# Patient Record
Sex: Female | Born: 1990 | Race: White | Hispanic: No | Marital: Single | Smoking: Current some day smoker
Health system: Southern US, Community
[De-identification: ages and names within clinical notes are randomized; demographics above are authoritative.]

## PROBLEM LIST (undated history)

## (undated) DIAGNOSIS — K759 Inflammatory liver disease, unspecified: Secondary | ICD-10-CM

## (undated) DIAGNOSIS — R87629 Unspecified abnormal cytological findings in specimens from vagina: Secondary | ICD-10-CM

## (undated) DIAGNOSIS — B1920 Unspecified viral hepatitis C without hepatic coma: Secondary | ICD-10-CM

## (undated) DIAGNOSIS — F909 Attention-deficit hyperactivity disorder, unspecified type: Secondary | ICD-10-CM

## (undated) DIAGNOSIS — F419 Anxiety disorder, unspecified: Secondary | ICD-10-CM

## (undated) DIAGNOSIS — F32A Depression, unspecified: Secondary | ICD-10-CM

## (undated) HISTORY — PX: BREAST SURGERY: SHX581

## (undated) HISTORY — DX: Unspecified abnormal cytological findings in specimens from vagina: R87.629

## (undated) HISTORY — DX: Anxiety disorder, unspecified: F41.9

## (undated) HISTORY — DX: Depression, unspecified: F32.A

## (undated) HISTORY — DX: Attention-deficit hyperactivity disorder, unspecified type: F90.9

---

## 2015-06-20 ENCOUNTER — Emergency Department (EMERGENCY_DEPARTMENT_HOSPITAL): Payer: PRIVATE HEALTH INSURANCE | Admitting: Professional

## 2015-06-20 ENCOUNTER — Inpatient Hospital Stay: Payer: PRIVATE HEALTH INSURANCE | Admitting: Addiction Medicine

## 2015-06-20 ENCOUNTER — Emergency Department
Admission: EM | Admit: 2015-06-20 | Discharge: 2015-06-20 | Disposition: A | Discharge status: Other Acute Inpatient Hospital | Payer: Commercial Managed Care - PPO | Attending: Emergency Medicine | Admitting: Emergency Medicine

## 2015-06-20 ENCOUNTER — Encounter (HOSPITAL_BASED_OUTPATIENT_CLINIC_OR_DEPARTMENT_OTHER): Payer: Self-pay | Admitting: Professional

## 2015-06-20 ENCOUNTER — Inpatient Hospital Stay
Admission: AD | Admit: 2015-06-20 | Discharge: 2015-06-26 | DRG: 895 | Disposition: A | Discharge status: Home / Self Care | Payer: PRIVATE HEALTH INSURANCE | Source: Ambulatory Visit | Attending: Addiction Medicine | Admitting: Addiction Medicine

## 2015-06-20 ENCOUNTER — Encounter: Payer: Self-pay | Admitting: Addiction Medicine

## 2015-06-20 DIAGNOSIS — F1120 Opioid dependence, uncomplicated: Secondary | ICD-10-CM

## 2015-06-20 DIAGNOSIS — F1320 Sedative, hypnotic or anxiolytic dependence, uncomplicated: Secondary | ICD-10-CM

## 2015-06-20 DIAGNOSIS — F17210 Nicotine dependence, cigarettes, uncomplicated: Secondary | ICD-10-CM | POA: Diagnosis present

## 2015-06-20 DIAGNOSIS — F1310 Sedative, hypnotic or anxiolytic abuse, uncomplicated: Secondary | ICD-10-CM | POA: Insufficient documentation

## 2015-06-20 DIAGNOSIS — F1910 Other psychoactive substance abuse, uncomplicated: Secondary | ICD-10-CM

## 2015-06-20 DIAGNOSIS — F1123 Opioid dependence with withdrawal: Principal | ICD-10-CM | POA: Diagnosis present

## 2015-06-20 DIAGNOSIS — B1920 Unspecified viral hepatitis C without hepatic coma: Secondary | ICD-10-CM | POA: Diagnosis present

## 2015-06-20 DIAGNOSIS — F419 Anxiety disorder, unspecified: Secondary | ICD-10-CM | POA: Diagnosis present

## 2015-06-20 DIAGNOSIS — F1110 Opioid abuse, uncomplicated: Secondary | ICD-10-CM | POA: Insufficient documentation

## 2015-06-20 DIAGNOSIS — R6889 Other general symptoms and signs: Secondary | ICD-10-CM | POA: Diagnosis present

## 2015-06-20 DIAGNOSIS — Z590 Homelessness: Secondary | ICD-10-CM

## 2015-06-20 HISTORY — DX: Unspecified viral hepatitis C without hepatic coma: B19.20

## 2015-06-20 LAB — CBC
Hematocrit: 41.3 % (ref 37.0–47.0)
Hgb: 13.5 g/dL (ref 12.0–16.0)
MCH: 28.8 pg (ref 28.0–32.0)
MCHC: 32.7 g/dL (ref 32.0–36.0)
MCV: 88.1 fL (ref 80.0–100.0)
MPV: 10.5 fL (ref 9.4–12.3)
Nucleated RBC: 0 /100 WBC (ref 0–1)
Platelets: 405 10*3/uL — ABNORMAL HIGH (ref 140–400)
RBC: 4.69 10*6/uL (ref 4.20–5.40)
RDW: 14 % (ref 12–15)
WBC: 8.5 10*3/uL (ref 3.50–10.80)

## 2015-06-20 LAB — URINALYSIS, REFLEX TO MICROSCOPIC EXAM IF INDICATED
Bilirubin, UA: NEGATIVE
Blood, UA: NEGATIVE
Glucose, UA: NEGATIVE
Ketones UA: NEGATIVE
Leukocyte Esterase, UA: NEGATIVE
Nitrite, UA: NEGATIVE
Protein, UR: NEGATIVE
Specific Gravity UA: 1.023 (ref 1.001–1.035)
Urine pH: 7 (ref 5.0–8.0)
Urobilinogen, UA: NORMAL mg/dL

## 2015-06-20 LAB — RAPID DRUG SCREEN, URINE
Barbiturate Screen, UR: NEGATIVE
Benzodiazepine Screen, UR: NEGATIVE
Cannabinoid Screen, UR: NEGATIVE
Cocaine, UR: NEGATIVE
Opiate Screen, UR: NEGATIVE
PCP Screen, UR: NEGATIVE
Urine Amphetamine Screen: NEGATIVE

## 2015-06-20 LAB — COMPREHENSIVE METABOLIC PANEL
ALT: 172 U/L — ABNORMAL HIGH (ref 0–55)
AST (SGOT): 92 U/L — ABNORMAL HIGH (ref 5–34)
Albumin/Globulin Ratio: 1 (ref 0.9–2.2)
Albumin: 4 g/dL (ref 3.5–5.0)
Alkaline Phosphatase: 105 U/L (ref 37–106)
BUN: 16 mg/dL (ref 7.0–19.0)
Bilirubin, Total: 0.4 mg/dL (ref 0.2–1.2)
CO2: 27 mEq/L (ref 22–29)
Calcium: 9.8 mg/dL (ref 8.5–10.5)
Chloride: 104 mEq/L (ref 100–111)
Creatinine: 0.8 mg/dL (ref 0.6–1.0)
Globulin: 3.9 g/dL — ABNORMAL HIGH (ref 2.0–3.6)
Glucose: 93 mg/dL (ref 70–100)
Potassium: 4.5 mEq/L (ref 3.5–5.1)
Protein, Total: 7.9 g/dL (ref 6.0–8.3)
Sodium: 138 mEq/L (ref 136–145)

## 2015-06-20 LAB — POCT PREGNANCY TEST, URINE HCG: POCT Pregnancy HCG Test, UR: NEGATIVE

## 2015-06-20 LAB — TSH: TSH: 0.9 u[IU]/mL (ref 0.35–4.94)

## 2015-06-20 LAB — GFR: EGFR: >60

## 2015-06-20 LAB — SALICYLATE LEVEL: Salicylate Level: <5 mg/dL — ABNORMAL LOW (ref 15.0–30.0)

## 2015-06-20 LAB — ACETAMINOPHEN LEVEL: Acetaminophen Level: <6 ug/mL — ABNORMAL LOW (ref 10–30)

## 2015-06-20 LAB — ETHANOL: Alcohol: NOT DETECTED mg/dL

## 2015-06-20 MED ORDER — PHENOBARBITAL 32.4 MG PO TABS
32.4000 mg | ORAL_TABLET | ORAL | Status: DC | PRN
Start: 2015-06-20 — End: 2015-06-26
  Administered 2015-06-20 – 2015-06-25 (×16): 32.4 mg via ORAL
  Filled 2015-06-20 (×16): qty 1

## 2015-06-20 MED ORDER — ALUM & MAG HYDROXIDE-SIMETH 200-200-20 MG/5ML PO SUSP
30.0000 mL | ORAL | Status: DC | PRN
Start: 2015-06-20 — End: 2015-06-26

## 2015-06-20 MED ORDER — HYDROXYZINE PAMOATE 25 MG PO CAPS
25.0000 mg | ORAL_CAPSULE | Freq: Four times a day (QID) | ORAL | Status: DC | PRN
Start: 2015-06-20 — End: 2015-06-26

## 2015-06-20 MED ORDER — IBUPROFEN 600 MG PO TABS
600.0000 mg | ORAL_TABLET | Freq: Four times a day (QID) | ORAL | Status: DC | PRN
Start: 2015-06-20 — End: 2015-06-26

## 2015-06-20 MED ORDER — VENLAFAXINE HCL ER 150 MG PO CP24
150.0000 mg | ORAL_CAPSULE | Freq: Every day | ORAL | Status: DC
Start: 2015-06-21 — End: 2015-06-26
  Administered 2015-06-21 – 2015-06-26 (×6): 150 mg via ORAL
  Filled 2015-06-20 (×6): qty 1

## 2015-06-20 MED ORDER — LOPERAMIDE HCL 2 MG PO CAPS
2.0000 mg | ORAL_CAPSULE | ORAL | Status: DC | PRN
Start: 2015-06-20 — End: 2015-06-26

## 2015-06-20 MED ORDER — LAMOTRIGINE 100 MG PO TABS
100.0000 mg | ORAL_TABLET | Freq: Every day | ORAL | Status: DC
Start: 2015-06-21 — End: 2015-06-26
  Administered 2015-06-21 – 2015-06-26 (×6): 100 mg via ORAL
  Filled 2015-06-20 (×6): qty 1

## 2015-06-20 MED ORDER — NALOXONE HCL 0.4 MG/ML IJ SOLN (WRAP)
0.8000 mg | INTRAMUSCULAR | Status: DC | PRN
Start: 2015-06-20 — End: 2015-06-26

## 2015-06-20 MED ORDER — ONDANSETRON 4 MG PO TBDP
4.0000 mg | ORAL_TABLET | Freq: Four times a day (QID) | ORAL | Status: DC | PRN
Start: 2015-06-20 — End: 2015-06-26

## 2015-06-20 MED ORDER — TUBERCULIN PPD 5 UNIT/0.1ML ID SOLN
0.1000 mL | Freq: Once | INTRADERMAL | Status: DC
Start: 2015-06-21 — End: 2015-06-26

## 2015-06-20 MED ORDER — FOLIC ACID 1 MG PO TABS
1.0000 mg | ORAL_TABLET | Freq: Every day | ORAL | Status: DC
Start: 2015-06-21 — End: 2015-06-26
  Administered 2015-06-21 – 2015-06-26 (×6): 1 mg via ORAL
  Filled 2015-06-20 (×6): qty 1

## 2015-06-20 MED ORDER — NICOTINE POLACRILEX 2 MG MT GUM
2.0000 mg | CHEWING_GUM | OROMUCOSAL | Status: DC | PRN
Start: 2015-06-20 — End: 2015-06-26
  Administered 2015-06-21: 2 mg via BUCCAL
  Filled 2015-06-20: qty 1

## 2015-06-20 MED ORDER — TRAZODONE HCL 50 MG PO TABS
75.0000 mg | ORAL_TABLET | Freq: Every evening | ORAL | Status: DC | PRN
Start: 2015-06-20 — End: 2015-06-20

## 2015-06-20 MED ORDER — PROMETHAZINE HCL 25 MG/ML IJ SOLN
12.5000 mg | INTRAMUSCULAR | Status: DC | PRN
Start: 2015-06-20 — End: 2015-06-26

## 2015-06-20 MED ORDER — THIAMINE HCL 100 MG PO TABS
100.0000 mg | ORAL_TABLET | Freq: Every day | ORAL | Status: DC
Start: 2015-06-21 — End: 2015-06-26
  Administered 2015-06-21 – 2015-06-26 (×6): 100 mg via ORAL
  Filled 2015-06-20 (×6): qty 1

## 2015-06-20 MED ORDER — QUETIAPINE FUMARATE 100 MG PO TABS
200.0000 mg | ORAL_TABLET | Freq: Every evening | ORAL | Status: DC
Start: 2015-06-21 — End: 2015-06-26
  Administered 2015-06-21 – 2015-06-25 (×5): 200 mg via ORAL
  Filled 2015-06-20 (×5): qty 2

## 2015-06-20 MED ORDER — ALIAS MED C3A
1.0000 | ORAL_FILM | SUBLINGUAL | Status: DC | PRN
Start: 2015-06-20 — End: 2015-06-22
  Administered 2015-06-21 (×4): 1 via SUBLINGUAL
  Filled 2015-06-20 (×4): qty 1

## 2015-06-20 MED ORDER — CLONIDINE HCL 0.1 MG PO TABS
0.1000 mg | ORAL_TABLET | ORAL | Status: DC | PRN
Start: 2015-06-20 — End: 2015-06-26

## 2015-06-20 MED ORDER — MAGNESIUM HYDROXIDE 400 MG/5ML PO SUSP
30.0000 mL | Freq: Every evening | ORAL | Status: DC | PRN
Start: 2015-06-20 — End: 2015-06-26

## 2015-06-20 MED ORDER — TAB-A-VITE/BETA CAROTENE PO TABS
1.0000 | ORAL_TABLET | Freq: Every day | ORAL | Status: DC
Start: 2015-06-21 — End: 2015-06-26
  Administered 2015-06-21 – 2015-06-26 (×6): 1 via ORAL
  Filled 2015-06-20 (×6): qty 1

## 2015-06-20 MED ORDER — ATENOLOL 50 MG PO TABS
25.0000 mg | ORAL_TABLET | Freq: Two times a day (BID) | ORAL | Status: DC | PRN
Start: 2015-06-20 — End: 2015-06-26

## 2015-06-20 MED ORDER — DICYCLOMINE HCL 10 MG PO CAPS
20.0000 mg | ORAL_CAPSULE | ORAL | Status: DC | PRN
Start: 2015-06-20 — End: 2015-06-26

## 2015-06-20 MED ORDER — NICOTINE 21 MG/24HR TD PT24
1.0000 | MEDICATED_PATCH | Freq: Every day | TRANSDERMAL | Status: DC
Start: 2015-06-21 — End: 2015-06-26
  Administered 2015-06-21: 1 via TRANSDERMAL
  Filled 2015-06-20 (×3): qty 1

## 2015-06-20 NOTE — Discharge Instructions (Signed)
Dear Ms. Runnion:    Thank you for choosing the [facility] Emergency Department, the premier emergency department in the [place] area.  I hope your visit today was EXCELLENT.    Specific instructions for your visit today:      Substance / Drug Abuse    After evaluating you today, your doctor believes that you have a problem with substance abuse.    Substance abuse is a physical addiction to a drug. It is a serious problem that can be life-threatening. It can ruin your life as well as the lives of those who care about you.    It is important to have a counselor and a family doctor who see you on a regular basis. A counselor can help you with your problem, keep a close eye on you and follow your progress.    Stay with someone who can watch you tonight and who can help you avoid situations where you are likely to abuse again.    DO NOT DRIVE A VEHICLE UNDER THE INFLUENCE OF ALCOHOL OR OTHER ILLEGAL SUBSTANCES! YOU MAY INJURE OR KILL YOURSELF OR SOMEONE ELSE IF YOU DRINK OR USE AND DRIVE.    YOU SHOULD SEEK MEDICAL ATTENTION IMMEDIATELY, EITHER HERE OR AT THE NEAREST EMERGENCY DEPARTMENT, IF ANY OF THE FOLLOWING OCCURS:   You think of harming yourself or committing suicide.    You feel unsafe in your home environment.   You become worse or feel that you cannot wait until your follow-up appointment for treatment.                If you do not continue to improve or your condition worsens, please contact your doctor or return immediately to the Emergency Department.    Sincerely,  Koleen Distance, MD  Attending Emergency Physician  [facility] Emergency Department    ONSITE PHARMACY  Our full service onsite pharmacy is located in the ER waiting room.  Open 7 days a week from 9 am to 11 pm.  We accept all major insurances and prices are competitive with major retailers.  Ask your provider to print your prescriptions down to the pharmacy to speed you on your way home.    OBTAINING A PRIMARY CARE  APPOINTMENT    Primary care physicians (PCPs, also known as primary care doctors) are either internists or family medicine doctors. Both types of PCPs focus on health promotion, disease prevention, patient education and counseling, and treatment of acute and chronic medical conditions.    Call for an appointment with a primary care doctor.  Ask to see who is taking new patients.     [facility]  telephone:  [phone]  [email or web address]    DOCTOR REFERRALS  Call [phone] (available 24 hours a day, 7 days a week) if you need any further referrals and we can help you find a primary care doctor or specialist.  Also, available online at:  [email or web address]    YOUR CONTACT INFORMATION  Before leaving please check with registration to make sure we have an up-to-date contact number.  You can call registration at [phone] to update your information.  For questions about your hospital bill, please call [phone].  For questions about your Emergency Dept Physician bill please call [phone].      FREE HEALTH SERVICES  If you need help with health or social services, please call 2-1-1 for a free referral to resources in your area.  2-1-1 is a free service connecting  people with information on health insurance, free clinics, pregnancy, mental health, dental care, food assistance, housing, and substance abuse counseling.  Also, available online at:  http://www.[place].org    MEDICAL RECORDS AND TESTS  Certain laboratory test results do not come back the same day, for example urine cultures.   We will contact you if other important findings are noted.  Radiology films are often reviewed again to ensure accuracy.  If there is any discrepancy, we will notify you.      Please call [phone] to pick up a complimentary CD of any radiology studies performed.  If you or your doctor would like to request a copy of your medical records, please call [phone].      ORTHOPEDIC  INJURY   Please know that significant injuries can exist even when an initial x-ray is read as normal or negative.  This can occur because some fractures (broken bones) are not initially visible on x-rays.  For this reason, close outpatient follow-up with your primary care doctor or bone specialist (orthopedist) is required.    MEDICATIONS AND FOLLOWUP  Please be aware that some prescription medications can cause drowsiness.  Use caution when driving or operating machinery.    The examination and treatment you have received in our Emergency Department is provided on an emergency basis, and is not intended to be a substitute for your primary care physician.  It is important that your doctor checks you again and that you report any new or remaining problems at that time.      24 HOUR PHARMACIES  The nearest 24 hour pharmacy is:    [facility]  [address]  [place], [place] [address]  [phone]      ASSISTANCE WITH INSURANCE    Affordable Care Act  [facility])  Call to start or finish an application, compare plans, enroll or ask a question.  [phone]  TTY: [phone]  Web:  Healthcare.gov    Help Enrolling in [facility]  [employer] [place]  (707)015-0034 (TOLL-FREE)  [phone] (TTY)  Web:  Http://www.coverva.org    Local Help Enrolling in the [facility]  [place] [facility]  [phone] (MAIN)  Email:  health-help@nvfs .org  Web:  BlackjackMyths.is  Address:  [address], [address] [place], [place] [address]    SEDATING MEDICATIONS  Sedating medications include strong pain medications (e.g. narcotics), muscle relaxers, benzodiazepines (used for anxiety and as muscle relaxers), Benadryl/diphenhydramine and other antihistamines for allergic reactions/itching, and other medications.  If you are unsure if you have received a sedating medication, please ask your physician or nurse.  If you received a sedating medication: DO NOT drive a car. DO NOT operate machinery. DO NOT perform  jobs where you need to be alert.  DO NOT drink alcoholic beverages while taking this medicine.     If you get dizzy, sit or lie down at the first signs. Be careful going up and down stairs.  Be extra careful to prevent falls.     Never give this medicine to others.     Keep this medicine out of reach of children.     Do not take or save old medicines. Throw them away when outdated.     Keep all medicines in a cool, dry place. DO NOT keep them in your bathroom medicine cabinet or in a cabinet above the stove.    MEDICATION REFILLS  Please be aware that we cannot refill any prescriptions through the ER. If you need  further treatment from what is provided at your ER visit, please follow up with your primary care doctor or your pain management specialist.    FREESTANDING EMERGENCY DEPARTMENTS OF [facility]  Did you know [facility] has two freestanding ERs located just a few miles away?  [facility] and [facility] [place] have short wait times, easy free parking directly in front of the building and top patient satisfaction scores - and the same Board Certified Emergency Medicine doctors as [facility].              Zyair Rhein  [id number]  [phone]  [phone]  06/20/2015    Discharge Instructions    As always, you are the most important factor in your recovery.  Please follow these instructions carefully.  If you have problems that we have not discussed, CALL OR VISIT YOUR DOCTOR RIGHT AWAY.     If you can't reach your doctor, return to the emergency department.    I Nida Boatman understand the written and discussed instructions.  My questions have been answered.  I acknowledge receipt of these instructions.     Patient or responsible person:         Patient's Signature               Physician or Nurse

## 2015-06-20 NOTE — ED Notes (Signed)
6:43 PM [name]  I completed a brief evaluation of this patient and placed initial orders in triage to expedite patient care.  I am not the primary physician taking care of this patient.  Well appearing, cooperative in triage.    Leanora Ivanoff, MD  06/20/15 [phone]

## 2015-06-20 NOTE — Progress Notes (Signed)
This Clinical research associate called [facility] and spoke with Reynold Bowen [phone] who took initial information to pass to care manager. Care manager will call [facility] [phone] to take clinical information and complete prior authorization. Prior authorization has not been obtained at this time.

## 2015-06-20 NOTE — H&P (Signed)
[facility] - HISTORY and PHYSICAL    Date/Time:    06/21/2015    6.00 am  Patient Name: [name], [name]  MRN:  [id number]  Age: 25 y.o.  DOB: 1991/01/03    Vital Signs:     02/20 0700  02/21 0659 02/21 0700  02/21 2330    Most Recent         Temp (F)       98.2-99.3  99.3 (37.4)     Heart Rate       66-91  91     Resp Rate   18  18     BP   135/82  135/82     SpO2 (%)       96-98  98     Height (cm)   165.1  165.1 cm (5\' 5" )     Weight (kg)   74.844  74.844 kg (165 lb)     BMI (calculated)   27.5  27.5              Allergies:   No Known Allergies    Chief Complaints:   "I need to detox"  Anxiety  Depressed      Current Health Problems:   This is a 25 year old female admitted from the ER after medical clearance.  Patient was treated in [facility] but relapsed soon after.  She was on 2-3 grams of heroin IV until 2 weeks ago.  She has been using Suboxone 8-16 mg a day off the street.  She also uses Xanax 1-2  Bars a day.  She is anxious and depressed on Lamictal, Serouel and Effexor XR.  She has HCV.  She smokes half a pack a day.            Review of Systems:   A comprehensive review of systems was negative except for:  Constitutional: positive for chills and sweats  Eyes: Negative  Ears, nose, mouth, throat, and face: Negative  Respiratory: Negative  Cardiovascular: Negative  Gastrointestinal: Negative  Integument/breast: Negative  Hematologic/lymphatic: Negative  Musculoskeletal: Negative  Neurological: positive for Negative  Behavioral/Psych: positive for anxiety, depression and illegal drug usage  Endocrine: Negative    History of Past Illnesses (Serious illnesses, injuries, surgeries, hospitalizations, including psychiatric admissions):     Past Medical History   Diagnosis Date   . Hepatitis C        Family Medical History:   History reviewed. No pertinent family history.    Social History:     History   Alcohol Use No     History   Drug Use Not on file     History   Smoking status   . Current  Every Day Smoker -- 0.50 packs/day   . Types: Cigarettes   Smokeless tobacco   . Never Used         Physical Exam:     General appearance - oriented to person, place, and time   Mental status -  anxious  Eyes - pupils equal and reactive, extraocular eye movements intact  Ears - bilateral TM's and external ear canals normal  Nose - no septal perforation  Mouth - mucous membranes moist, pharynx normal without lesions  Neck - supple, no significant adenopathy  Chest - clear to auscultation, no wheezes, rales or rhonchi, symmetric air entry  Heart - normal rate, regular rhythm, normal S1, S2, no murmurs, rubs, clicks or gallops  Abdomen - soft, nontender, nondistended, no masses or organomegaly  Pelvic - deferred to PCP  LMP - Patient's last menstrual period was 06/02/2015.  Is patient pregnant: No  Rectal - deferred to PCP.  Neurological - alert, oriented, normal speech, no focal findings or movement disorder noted, screening mental status exam normal, neck supple without rigidity, cranial nerves II through XII intact, motor and sensory grossly normal bilaterally, normal muscle tone, mild tremors, strength 5/5  Musculoskeletal - no joint tenderness, deformity or swelling  Extremities - peripheral pulses normal, no pedal edema, no clubbing or cyanosis  Skin - track marks    Mental Status Exam:   Appearance: No apparent distress  Behavior/relationship to examiner/demeanor:  Guarded  Motor activity/EPS:  Normal  Gait:   Normal  Mood (subjective report):   anxious  Affect (objective appearance):  Appropriate/mood-congruent  Fund of Knowledge/Intelligence:   Average  Abstraction:   Normal  Insight:   Fair  Judgment:   Fair      Clinical Diagnosis:    Axis I: Sedative dependence, Anxiety disorder, unspecified anxiety disorder type, Depressive disorder and Opioid dependence with withdrawal   Axis ZO:XWRUE   Axis III: HCV   Axis IV: Other psychosocial or environmental problems   Problems with primary support group   Axis  V:41-50: serious symptoms.: Highest in the last year 65.    This patient has been informed of their individual treatment plan.  This includes an explanation of the action of all prescribed psychoactive medications.    List of Prescribed Psychoactive Medications (Antidepressants, Mood Stabilizers, Antipsychotics, Antianxiety, Stimulants):   Suboxone  Phenobarbital  Vistaril  Effexor XR  Seroquel  Lamictal    The benefits, potential side effects, risks, and possible drug interactions were explained to this patient who indicated that she understood and agrees to the plan of care.   All patient questions were answered.    Plan:  Admit to Detoxification  Phenobarbital Detoxification  Suboxone Detoxification  Substance Abuse Counseling  IOP Triage  Discuss with treatment team  Review labs with patient  COUNSEL ON SMOKING CESSATION  OFFER Rx TO STOP SMOKING    I certify that inpatient services are medically necessary for this patient.      Signed by: Nicholaus Bloom, MD     06/21/2015    6.00 am

## 2015-06-20 NOTE — Progress Notes (Signed)
Date/Time: 06/20/2015, 9:28 AM  Interviewer: [name]  Patient Name: [name]   DOB: [birth year]  SSN: [id number]  Gender: female  Patient Address:  [address] [place] Dakin [place]  Patient Phone Number(s):  There are no phone numbers on file. , additional phone number: [phone]    Have you confirmed the pt's contact information above? Yes      Emergency Contact: Bernerd Pho, Phone number: [phone]  Relationship to Pt: MOther   May we contact this person regarding your admission? yes    Caller: Self  , Caller phone:Self, Caller relationship:    Insurance Info:   Company: Development worker, community (PH):   [place] [employer]   Relationship to PT: Mother     PH DOB: [birth year]  Member ID: [id number]  Group #: [id number]    Insured Employer: [employer]  Phone number: Providers/Mental Health/SA: [phone]  Insurance Address: [address] [place] [address]    Identify if possible:   Referring Provider Name: My boyfriend was there  Phone Number, if available:      Clinical Information  Presenting Problem: I have been taking suboxone and benzos, I need to detox  Precipitating Event: I want to get into sober living, so I need to do this  Suicidal/Homicidal Risk?    Any hx of Suicidal thoughts or plans? No    Any hx of Homicidal Thoughts or Plans?  No    Imminent Risk of Suicide and/or Harm to Others?  No         Drug Use  Do you use tobacco?: Yes cigarettes, a half a pack a day     Any current use of alcohol or drugs? benzodiazepines and suboxone      Drug of Choice # 1: Suboxone  Pattern of use: I take 2 8mg  a day, and I don't have a prescription, I buy them at the street. Sublingual use. I have been doing it for about a month. I was using heroin beforre that. I would use 3gr a day IV. Has not done it in a month   Last use? This am    Drug of Choice # 2 : Benzodiazepines  Pattern of use: 3 bar Xanax a day, I have been doing this for a year. I take them together. Oral use  Last use? Today    Drug  of Choice # 3 : Denied  Pattern of use: Denied  Last use?: Denied    Additional drug use info:     Current Withdrawal Symptoms:  None at the moment    History of and last date of:    Hallucinations? No          Seizures?  No    Psych/Mental health Issues: depression and anxiety        Medical issues: Hep C, I have not done anything. She has been aware of it for a year    Current Medication (medical or psychiatric): Effexor 150mg , Lamictal 150mg , Seroquel 200mg . I take them every day    Additional Notes:     Does the patient require Hard of Hearing Services? No    Does the patient require language services? No    Preliminary Diagnosis: Opioid Use Disorder, Severe and Sedative, Hypnotic or Anxiolytic Use Disorder, Severe    Recommended Level of Care (LOC): inpatient    Appointment with: [facility] Adm nurse      Location: ED [facility] ?[facility] inpatient  Date: 06/20/15             Time: 1900    LOC: Inpatient - Alcohol, benzodiazepines & sometimes opiates (use within the last 48 hours)  Day Treatment - Opiates & everything else (or last use >48 hours); NOT marijuana  IOP - 7 days abstinent from all substances EXCEPT marijuana    * Current IOP counselors that do assessments:   Rosana Hoes, Laroy Apple, Kevan Rosebush, Victorino Dike    * [facility] DTX appointments 8am or 10am Mon-Fri ONLY at [address] (Do not include "step down" appointments in the scheduling process)     *[facility] IP appointments 1230pm & 2pm - after hours starts @ 3pm; appointments every 2 hours. Last appt at 9pm    [facility]    * In Basket Instructions   - [facility] IP; Gypsy Lore, Eula Listen, & Malachy Mood   - [facility] IOP; Blondell Reveal Marvell Fuller, & assessment    clinican

## 2015-06-20 NOTE — Progress Notes (Signed)
Psychiatric Evaluation Part I    [name] is a 25 y.o. female admitted to the [facility] Emergency Department who was seen face to face on 06/20/2015 by Louanne Belton, LPC.    Call Details  Patient Location: [facility] ED  Patient Room Number: RW 4  Time contacted by ED Physician: 2055  Time consult began: 2120  Time (in minutes) from Call to Consult: 25  Time consult concluded: 2135           Discharge Planning  Living Arrangements: Alone  Support Systems: Parent, Family members, Spouse/significant other, Other (Comment), Friends/neighbors (Pt reported that she attends NA meetings)  Type of Residence: Homeless (Pt reported she has been staying in the back of her boyfriend's car)  Patient expects to be discharged to:: [facility] Inpatient Detox    Presenting Mental Status  Orientation Level: Oriented X4  Memory: No Impairment  Thought Content: normal  Thought Process: concrete  Behavior: agitated, restless  Consciousness: Alert  Impulse Control: impaired  Perception: normal  Eye Contact: poor  Attitude: cooperative  Mood: elevated, anxious, irritable  Hopelessness Affects Goals: No  Hopelessness About Future: No  Affect: normal  Speech: pressured, loud  Concentration: impaired  Insight: poor  Judgment: fair  Appearance: normal  Appetite: normal  Weight change?: normal  Energy: normal  Sleep: normal  Reliability of Reporter/Patient: questionable    Tool for Assessment of Suicide Risk  Individual Risk Profile: age 84-30, psychiatric illness (Pt reported a hx of depression, anxiety, ADHD, PTSD)  Symptom Risk Profile: anxiety, agitation, impulsivity  Interview Risk Profile: recent substance abuse  Protective Factors: supportive significant other  Level of Suicide Risk: Low  Monitoring/Suicide Alert Level: Routine monitoring    Within the Last 6 Months:: no history of violence toward self  Greater than 6 Months Ago:: no history of violence toward self              Substance Abuse History  Previous Substance Abuse Treatment?: Yes  #  of Previous Treatment Episodes: 2  Discharge Date from Previous Treatment?: 1.5 weeks prior  Location of Previous Substance Abuse Treatment: [facility]  Self-Help Group Involvement  Current Involvement: NA  Sponsor: Denied     Past Withdrawal Symptoms  Past Withdrawal Symptoms: Anxiety, Irritability, Nausea/vomiting, Runny nose, Tearing, Yawning, Tremors, Sweats, Restless Legs  History of Blackouts?: No  History of Withdrawal Seizures?: No    Preliminary Diagnosis #1: Opioid use disorder, severe, dependence (F11.20)  Preliminary Diagnosis #2: Sedative, hypnotic, or anxiolytic use disorder, severe, 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)        Violence Toward Others  Within the Last 6 Months:: no history of violence toward others  Greater than 6 Months Ago:: no history of violence toward others     Preliminary Diagnosis (DSM IV)  Axis I: Opioid use disorder, severe, dependence (F11.20); Sedative, hypnotic, or anxiolytic use disorder, severe, 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  Pt was cooperative during assessment with anxious mood and affect and pressured speech.  Pt's mother was at bedside.  Pt reported that she had been to treatment "a couple" of other times, with most recent treatment approximately 1.5 weeks ago at [facility].  Pt reported no sustained sobriety, and stated she got "high" after 5 days of treatment.  Pt  reported that she had been using heroin for approximately 3 years, in the amount of 2-3 grams daily by IV with last use approximately 2 weeks prior to this writing.  Pt reported that once she stopped using heroin she began using Suboxone.  Pt reported that she does not have a prescription for suboxone but gets in off the street, and uses approximately 8 mg. Daily.  Records indicate  that Pt earlier reported using 16 mg. Daily.  Pt reported last use on 06/19/2015.  Pt reported that she uses Xanax which is not prescribed, and uses approximately 1-2 bars daily, with last use on this day, 06/18/2015.  Records indicate that Pt earlier reported using 6 mg. Of Xanax daily, with last use on this day, 06/20/2015.  Pt appeared to be an unreliable reporter, as evidenced by differing reports. Pt did not report any alcohol use.  Pt reported a hx of Depression, Anxiety, ADHD, and PTSD.  Pt reported that she is prescribed Effexor, Seroquel, and Lamictal, and that she takes these medications as prescribed.  Pt denied any SI/HI or any hallucinations.  Pt denied any hx of blackouts or seizures.  Pt reported that the precipitating factor for seeking treatment is that she plans to go to a sober living facility, and they have required that she gets detoxed beforehand.  Pt reported that she has been living in the back of her boyfriend's car. Pt will be a voluntary admission to [facility] Inpatient Detox.    Disposition: [facility] Inpatient Detox - [facility] - Dr. Suanne Marker    Insurance Pre-authorization information:  Commercial Generic - Please see technician's note for prior authorization information.      Susa Griffins, CSAC  Psychiatric Liaison

## 2015-06-20 NOTE — ED Provider Notes (Signed)
[name] EMERGENCY DEPARTMENT H&P      Visit date: 06/20/2015      CLINICAL SUMMARY          Diagnosis:    .     Final diagnoses:   Polysubstance abuse         MDM Notes:      89F h/o benzo abuse and on suboxone who is medically cleared for detox admission.       Disposition:         Discharge         Discharge Prescriptions     [name]                     CLINICAL INFORMATION        HPI:      Chief Complaint: Inpatient Detox  .    [name] is a 25 y.o. female with h/o Hepatitis C who presents requesting medical clearance for inpatient detox. Pt is trying to detox from Xanax and Suboxone so that she can get into a sober house. Called [facility] earlier today and told she had a bed if medically cleared. Denies recent cough, fever, or illness. No current IVDU. Uses benzos every other day. No withdrawal symptoms.      History obtained from: Patient      ROS:      Positive and negative ROS elements as per HPI.  All other systems reviewed and negative.      Physical Exam:      Pulse 66  BP 135/82 mmHg  Resp 18  SpO2 96 %  Temp 98.2 F (36.8 C)    GEN: Well appearing, no distress, nontremulous.    HEENT: Atraumatic, nl conjunctiva.       Chest: CTAB, no resp distress.        CV: RRR, no murmurs.        Abd: Soft, non-tender.          MSK: No obvious deformity, moving all extremities.  Neuro: GCS 15, normal speech, normal memory.         Skin: Warm and dry.   Psych: Normal affect, normal insight.             PAST HISTORY        Primary Care Provider: No primary care provider on file.        PMH/PSH:    .     Past Medical History   Diagnosis Date   . Hepatitis C        She has past surgical history that includes AUGMENTATION, BREAST, (COSMETIC) and LEEP PROCEDURE.      Social/Family History:      She reports that she has been smoking Cigarettes.  She has been smoking about 0.50 packs per day. She has never used smokeless tobacco. She reports that she does not drink alcohol. Her  drug history is not on file.    History reviewed. No pertinent family history.      Listed Medications on Arrival:    .     Home Medications     Last Medication Reconciliation Action:  Complete Moore-Singer, Tinnie Gens, LPN 54/12/8117  7:33 PM                  lamoTRIgine (LAMICTAL) 100 MG tablet     Take 100 mg by mouth daily.     QUEtiapine (SEROQUEL) 200 MG tablet     Take 200 mg by mouth nightly.  venlafaxine (EFFEXOR-XR) 150 MG 24 hr capsule     Take 150 mg by mouth daily.         Allergies: She has No Known Allergies.            VISIT INFORMATION        Clinical Course in the ED:      8:35 PM- Spoke with psych liaison; will come evaluate.    9:34 PM- Spoke with psych liaison; accepted to cats under Dr. Tomasita Crumble.        Medications Given in the ED:    .     ED Medication Orders     [name]            Procedures:      Procedures      Interpretations:      O2 sat-           saturation: 96 %; Oxygen use: room air; Interpretation: Normal    EKG -             interpreted by me: NSR at 78, Nl axis, Nl intervals, No ST changes.  Impression: Normal.                RESULTS        Lab Results:      Results     Procedure Component Value Units Date/Time    Urine Rapid Drug Screen [[id number]] Collected:  06/20/15 1958    Specimen Information:  Urine Updated:  06/20/15 2217     Amphetamine Screen, UR Negative      Barbiturate Screen, UR Negative      Benzodiazepine Screen, UR Negative      Cannabinoid Screen, UR Negative      Cocaine, UR Negative      Opiate Screen, UR Negative      PCP Screen, UR Negative     Narrative:      Per Pt Request    TSH [[id number]] Collected:  06/20/15 1958    Specimen Information:  Blood Updated:  06/20/15 2056     Thyroid Stimulating Hormone 0.90 uIU/mL     Narrative:      Per Pt Request    ASA  level [[id number]]  (Abnormal) Collected:  06/20/15 1958    Specimen Information:  Blood Updated:  06/20/15 2034     Salicylate Level <5.0 (L) mg/dL     Narrative:      Per Pt Request    GFR [[id number]]  Collected:  06/20/15 1958     EGFR >60.0 Updated:  06/20/15 2034    Narrative:      Per Pt Request    Comprehensive metabolic panel (CMP) [[id number]]  (Abnormal) Collected:  06/20/15 1958    Specimen Information:  Blood Updated:  06/20/15 2034     Glucose 93 mg/dL      BUN 75.6 mg/dL      Creatinine 0.8 mg/dL      Sodium 433 mEq/L      Potassium 4.5 mEq/L      Chloride 104 mEq/L      CO2 27 mEq/L      Calcium 9.8 mg/dL      Protein, Total 7.9 g/dL      Albumin 4.0 g/dL      AST (SGOT) 92 (H) U/L      ALT 172 (H) U/L      Alkaline Phosphatase 105 U/L      Bilirubin, Total 0.4 mg/dL  Globulin 3.9 (H) g/dL      Albumin/Globulin Ratio 1.0     Narrative:      Per Pt Request    Alcohol (Ethanol)  Level [[id number]] Collected:  06/20/15 1958    Specimen Information:  Blood Updated:  06/20/15 2034     Alcohol [name] Detected mg/dL     Narrative:      Per Pt Request    Acetaminophen level [[id number]]  (Abnormal) Collected:  06/20/15 1958    Specimen Information:  Blood Updated:  06/20/15 2034     Acetaminophen Level <6 (L) ug/mL     Narrative:      Per Pt Request    UA, Reflex to Microscopic (All hospital ED's and [facility], pts 3+ yrs) [[id number]] Collected:  06/20/15 1958    Specimen Information:  Urine Updated:  06/20/15 2023     Urine Type Clean Catch      Color, UA Yellow      Clarity, UA Clear      Specific Gravity UA 1.023      Urine pH 7.0      Leukocyte Esterase, UA Negative      Nitrite, UA Negative      Protein, UR Negative      Glucose, UA Negative      Ketones UA Negative      Urobilinogen, UA Normal mg/dL      Bilirubin, UA Negative      Blood, UA Negative     Narrative:      Per Pt Request    CBC without differential [[id number]]  (Abnormal) Collected:  06/20/15 1958    Specimen Information:  Blood from Blood Updated:  06/20/15 2019     WBC 8.50 x10 3/uL      Hgb 13.5 g/dL      Hematocrit 62.9 %      Platelets 405 (H) x10 3/uL      RBC 4.69 x10 6/uL      MCV 88.1 fL      MCH 28.8 pg      MCHC 32.7  g/dL      RDW 14 %      MPV 10.5 fL      Nucleated RBC 0 /100 WBC     Narrative:      Per Pt Request    Urine HCG POC ([facility] Only) [[id number]] Collected:  06/20/15 1954     POCT QC Pass Updated:  06/20/15 2005     POCT Pregnancy HCG Test, UR Negative      Comment:        Result:        Negative Value is Normal in Healthy Males or Healthy non-pregnant Females              Radiology Results:      No orders to display               Scribe Attestation:      I was acting as a Neurosurgeon for Koleen Distance, MD on [place], [place]     I am the first provider for this patient and I personally performed the services documented. Dorathy Daft is scribing for me on [place]. This note and the patient instructions accurately reflect work and decisions made by me.  Koleen Distance, MD         Koleen Distance, MD  06/21/15 [phone]

## 2015-06-21 DIAGNOSIS — F909 Attention-deficit hyperactivity disorder, unspecified type: Secondary | ICD-10-CM

## 2015-06-21 DIAGNOSIS — F419 Anxiety disorder, unspecified: Secondary | ICD-10-CM

## 2015-06-21 DIAGNOSIS — F1320 Sedative, hypnotic or anxiolytic dependence, uncomplicated: Secondary | ICD-10-CM

## 2015-06-21 DIAGNOSIS — B1920 Unspecified viral hepatitis C without hepatic coma: Secondary | ICD-10-CM

## 2015-06-21 DIAGNOSIS — Z7189 Other specified counseling: Secondary | ICD-10-CM

## 2015-06-21 DIAGNOSIS — F1120 Opioid dependence, uncomplicated: Secondary | ICD-10-CM

## 2015-06-21 DIAGNOSIS — R6889 Other general symptoms and signs: Secondary | ICD-10-CM

## 2015-06-21 DIAGNOSIS — F329 Major depressive disorder, single episode, unspecified: Secondary | ICD-10-CM

## 2015-06-21 LAB — ECG 12-LEAD
Atrial Rate: 78 {beats}/min
P Axis: 53 degrees
P-R Interval: 144 ms
Q-T Interval: 394 ms
QRS Duration: 86 ms
QTC Calculation (Bezet): 449 ms
R Axis: 25 degrees
T Axis: 22 degrees
Ventricular Rate: 78 {beats}/min

## 2015-06-21 LAB — HCG QUANTITATIVE: hCG, Quant.: <1.2 (ref 0.0–4.9)

## 2015-06-21 LAB — LIPID PANEL
Cholesterol / HDL Ratio: 3.5
Cholesterol: 178 mg/dL (ref 0–199)
HDL: 51 mg/dL (ref 40–9999)
LDL Calculated: 110 mg/dL — ABNORMAL HIGH (ref 0–99)
Triglycerides: 86 mg/dL (ref 34–149)
VLDL Calculated: 17 mg/dL (ref 10–40)

## 2015-06-21 LAB — GGT: GGT: 45 U/L — ABNORMAL HIGH (ref 9–36)

## 2015-06-21 LAB — HIV AG/AB 4TH GENERATION: HIV Ag/Ab, 4th Generation: NONREACTIVE

## 2015-06-21 LAB — HEPATITIS C ANTIBODY: Hepatitis C, AB: REACTIVE — AB

## 2015-06-21 LAB — HEMOLYSIS INDEX: Hemolysis Index: 0 (ref 0–18)

## 2015-06-21 LAB — HEPATITIS B SURFACE ANTIGEN W/ REFLEX TO CONFIRMATION: Hepatitis B Surface Antigen: NONREACTIVE

## 2015-06-21 MED ORDER — SILVER SULFADIAZINE 1 % EX CREA
TOPICAL_CREAM | Freq: Every day | CUTANEOUS | Status: DC
Start: 2015-06-21 — End: 2015-06-26
  Filled 2015-06-21: qty 50

## 2015-06-21 NOTE — [facility] Treatment History (Signed)
CHEMICAL DEPENDENCE AND MENTAL HEALTH TREATMENT HISTORY  INCLUDE BOTH INPATIENT AND OUTPATIENT TREATMENT ATTEMPTS            Facility Name Dates Presenting Problem Outcome/Years Sober    she says she has been "in a lot of treatment centers" since age 25       methadone clinic  2016      [facility]  1.5 weeks ago      [facility] IP  06/20/15  opiates, benzos     PCP, none       Psych IP  6 months ago  depression

## 2015-06-21 NOTE — Plan of Care (Signed)
[name] is being assessed Q 2 hours for symptom of opiate/sedative withdrawal and meds effectiveness. VS are stable with CIWA/COW score of 11/7, 3/7, 3/3 and 9. Reported moderate anxiety, irritability, restlessness, sweats and tremors. Medicated with suboxne 2 mg at 0847, 1040 and 1518 with good effect. She was also medicated with phenobarbital 32. 4 mg at 0848 and 1349 for anxiety with good effect. Is up and about the unit interacting with Discussed the effect of substance abuse/dependency on health and family. Verbalized understanding. Will continue to assess, educate and medicate per protocol.   Lanier Prude, RN, MSN
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
## 2015-06-21 NOTE — Discharge Instructions (Addendum)
[facility] Continuing Care Plan, including the After Visit Summary (AVS) and the Psychiatrist's Discharge Summary were faxed  on 06/26/2015 to Valentino Hue at [facility].    RECOMMENDED THAT YOU COMPLY WITH THE FOLLOWING PLAN:   Follow up appointment with:  [facility]         Date: 06/26/2015 (Bed to bed)  Location:  Georga Hacking, MD   P: [phone]  F: [phone]    FOR EMERGENCY MENTAL HEALTH, CONTACT:  [facility] Emergency Services:  [address], [place], [place] [address]   [phone]; or call 911 or go to the closest emergency department.      National Suicide Prevention Lifeline :  581 385 3343    The following was reviewed with patient/family by ________________________ RN.    1. Reason for IP admission  2. Major procedures and tests, including summary of results  3. Diagnosis at discharge  4. Current medication list  5. Studies pending at discharge:       No      Yes                                                        If yes, you may call for results at: _______________________________(unit #)  6. Patient instructions  7. Patient has Advance Directive:     Yes      No                                                          If no, reason patient did not wish or was not able to name a surrogate decision maker or provide an Advance Directive :_______________________________          8. Contact information for emergencies  9. Plan for follow up care  10. Name of provider, appointment  and location of follow up care.    Tobacco Cessation Discharge Referral for Evidence Based Counseling and Cessation Medication:    A referral appointment for evidence-based counseling was offered, but patient declined.  Is not interested in tobacco cessation at this time.  Was advised to reconsider tobacco cessation counseling.  Referral information for smoking cessation providers, and instructions to make an appointment were provided to patient at the time of discharge.    Patient  was provided with the following Smoking Cessation Providers:  Rock Nephew, LPC  [address] [place] [address]  [phone]  [facility] Brooking, [facility]  [address], [place], [place] [address]  [phone]  [facility]  [address], [place], [place] [address]  [phone]  Doretha Sou [facility], [place], NCC, Individual, Couple, and  [facility]  [address], [place], [place] [address]  [phone]  Gurusher Lyndel Safe, Med, LPC  [address] Schwartz [place], [place] [address]  [phone]  [facility]  [address] [place], [place] [address]  [phone]  Maggie Font LPC  [address] [place], [place] [address]  [phone]  Josepha Pigg  [place], [place] [address]  [phone]  [facility]  [address], [place], [place] [address]  [phone]  www.naquitline.org: online and telephone counselors  . National Network of Tobacco Cessation Quitlines  Toll free hotline: 1-800-QUITNOW 617 216 6207)  TTY: 925-754-6396

## 2015-06-21 NOTE — Progress Notes (Signed)
BPS Note: This counselor later met 1:1 with pt to complete BPS interview. Pt presented as alert, well oriented, cooperative and maintained adequate eye contact. Pt reports the precipitating event for seeking treatment as to detox to go to sober living treatment. Pt denied current/past SI/HI. Pt does not have family history of SI/HI. Pt is presently unemployed and believes that her addiction has impact on her lack of employment. Pt believes that her finance is also impact by her addiction. Pt reports that she is not sure of her aftercare plan at this time. Pt presents in the contemplative stage of change as evidenced by pt's desire to seek detox". Pt was assessed and provided with appropriate treatment materials including "My Personal Journal." Pt treatment plan will focus on pt's education and his relapse prevention. The treatment plan was completed, initialed, signed, dated and placed in pt chart.    Counseling Note: Pt did not attend the community meeting this morning and reported during 1:1 meeting with this counselor that she was feeling "better except for the anxiety and discomfort of been in this environment again." Pt shared that she does not have a sponsor, has not made calls to find a sponsor and did not attend any meeting yesterday. Pt said that she is grateful for "being sober for today". Pt discussed her emotional concerns about her addiction. This counselor actively listened to the pt and assured his of unconditional support.  This counselor and pt discussed pt's aftercare plan following his discharge from this inpatient unit. Pt stated that he is presently unsure about his aftercare.    Transition Planning Note: This counselor and pt discussed plan for aftercare following her discharge. Pt stated that he is presently unsure about her aftercare.    Family Interview Note: Pt does not want her family to be contacted.

## 2015-06-21 NOTE — Plan of Care (Signed)
Problem: Safe medical management of withdrawal from (list substances of abuse) AS EVIDENCED BY...  Goal: Compliance with management of withdrawal syndrome  Reassessed at 1500, 1612 and 1831 for symptom of withdrawal. VS are stable with CIWA score of 3/9, 3/3 and 9/9. Reported moderate anxiety, restlessness, agitation and irritability. Medicated with suboxone 2 mg at 1518 for symptom of opiate withdrawal and phenobarbital 32. 4 mg at 1834 for anxiety with good effect. Is visible on the unit this evening watching TV, interacting with peers and attending. Will continue to assess and medicate per protocol.  Lanier Prude, RN, MSN
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
## 2015-06-21 NOTE — Progress Notes (Signed)
Nursing Admission Note: Pt is a 25 year old female admitted for opiate and benzo dependence. She reported that she has been using 2-3 grams of IV heroin for approximately 3 years with last use 2 weeks PTA. Pt reported that once she stopped using IV heroin, she began using Suboxone 16 mg daily with last use on 06/19/2015. Pt also reported taking Xanax 2-3 mg for 4-5 times per week with last use 2 days PTA.  Pt denies h/o withdrawal seizure, dt's or hallucinations. Pt denies SI/HI at this time. Upon assessment, pt was AA&Ox4, BP 109/66, HR 86, Temp. 97.0, RR 16. CIWA/COWS = 10/9 for nausea, tremors, sweats, anxiety, headache, runny nose, restlessness. BAL was negative. UDS was positive for opiates, tricyclic antidepressants, and oxycodone. Pt was medically cleared in the ED prior this admission.

## 2015-06-21 NOTE — Plan of Care (Signed)
[name] was assessed q2h for opiate and benzo withdrawal s/sx throughout the night. Pt was medicated with Phenobarbital 32.4 mg at 23:54 for CIWA = 10 for withdrawal symptoms of mild nausea, tremor, sweats, and anxiety with good effect. Pt slept well thorough the night as observed on q15 minutes rounding. VSS. RN will continue to monitor and maintain pt safety.

## 2015-06-21 NOTE — ED Notes (Signed)
This Clinical research associate spoke with Denny Peon F at [facility], she authorized 4 days, 06/20/2015 - 06/23/2015, with review on Friday 06/23/2015. The authorization number for the case is, [id number].

## 2015-06-22 LAB — HEPATITIS C RNA QUANTITATIVE, PCR
HCV RNA, PCR, Quant.: 772736 — ABNORMAL HIGH (ref <15)
HCV RNA, PCR, Quant: 5.89 — ABNORMAL HIGH (ref <1.18)

## 2015-06-22 LAB — RPR (REFLEX TO TITER AND CONFIRMATION): RPR: NONREACTIVE

## 2015-06-22 MED ORDER — QUETIAPINE FUMARATE 200 MG PO TABS
200.0000 mg | ORAL_TABLET | Freq: Every evening | ORAL | Status: AC
Start: 2015-06-22 — End: ?

## 2015-06-22 MED ORDER — ALIAS MED C3A
1.0000 | ORAL_FILM | Freq: Four times a day (QID) | SUBLINGUAL | Status: AC | PRN
Start: 2015-06-22 — End: 2015-06-23
  Administered 2015-06-22 (×3): 1 via SUBLINGUAL
  Filled 2015-06-22 (×3): qty 1

## 2015-06-22 MED ORDER — NICOTINE 21 MG/24HR TD PT24
1.0000 | MEDICATED_PATCH | Freq: Every day | TRANSDERMAL | Status: AC
Start: 2015-06-22 — End: ?

## 2015-06-22 MED ORDER — LAMOTRIGINE 100 MG PO TABS
100.0000 mg | ORAL_TABLET | Freq: Every day | ORAL | Status: AC
Start: 2015-06-22 — End: ?

## 2015-06-22 MED ORDER — ALIAS MED C3A
1.0000 | ORAL_FILM | Freq: Two times a day (BID) | SUBLINGUAL | Status: AC | PRN
Start: 2015-06-24 — End: 2015-06-25
  Administered 2015-06-24 – 2015-06-25 (×2): 1 via SUBLINGUAL
  Filled 2015-06-22 (×2): qty 1

## 2015-06-22 MED ORDER — ALIAS MED C3A
1.0000 | ORAL_FILM | Freq: Every day | SUBLINGUAL | Status: DC | PRN
Start: 2015-06-25 — End: 2015-06-26
  Administered 2015-06-26: 1 via SUBLINGUAL
  Filled 2015-06-22: qty 1

## 2015-06-22 MED ORDER — VENLAFAXINE HCL ER 150 MG PO CP24
150.0000 mg | ORAL_CAPSULE | Freq: Every day | ORAL | Status: AC
Start: 2015-06-22 — End: ?

## 2015-06-22 MED ORDER — HYDROXYZINE PAMOATE 25 MG PO CAPS
25.0000 mg | ORAL_CAPSULE | Freq: Four times a day (QID) | ORAL | Status: AC | PRN
Start: 2015-06-22 — End: ?

## 2015-06-22 MED ORDER — ALIAS MED C3A
1.0000 | ORAL_FILM | Freq: Three times a day (TID) | SUBLINGUAL | Status: AC | PRN
Start: 2015-06-23 — End: 2015-06-24
  Administered 2015-06-23 (×3): 1 via SUBLINGUAL
  Filled 2015-06-22 (×3): qty 1
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
## 2015-06-22 NOTE — Plan of Care (Signed)
Stormi was re-assessed at 04:12. CIWA/COWS = 4/4. No PRN medications were given at this time. BP 103/64 mmHg  Pulse 74  Temp(Src) 97.4 F (36.3 C)  Resp 16. Pt slept well through the night as observed on q15 minutes rounding. RN will continue to monitor and maintain pt safety.

## 2015-06-22 NOTE — Plan of Care (Signed)
Problem: Safe medical management of withdrawal from (list substances of abuse) AS EVIDENCED BY...  Goal: Compliance with management of withdrawal syndrome  Outcome: Progressing  Patient's assessment done at the beginning of the shift:  Remains visible, and loud in the mellieu interacting and socializing with both staff and peers.  Has no major behavioral issue, but notable loud with pressured speech.  Able to make needs known and very explanatory of her symptoms.  Had a female friend visiting during the Evening visiting hours; observed interacting and socializing with him.  Otherwise, accepted Night Time schedule medications and then went to bed early.  Will continue to monitor.  Staff monitoring for safety mand support is ongoing and Patient is able to maintain safety safety.

## 2015-06-22 NOTE — Progress Notes (Signed)
Counseling Note: Pt did not attend the community meeting this morning.. Pt shared that she does not have a sponsor, has not made calls and did not attend any meeting yesterday. Pt said that she is grateful for "being sober for today". This counselor later met 1:1 with pt and pt presented as defensive, agitated and some anxiety as evidence by stating"my mother said I can come off as defensive and some time I have to be a little hoody"(being from the hood)". Pt did not attend groups and was observed in her room on the phone most of the day.  Pt discussed his emotional concerns about his addictive behavior and history of his addiction. This counselor actively listened to the pt and assured her of unconditional support.       Transition Planning Note: This counselor and pt discussed pt aftercare plan following her discharge. Pt aftercare is to attend [facility] [address] [place],  [place], [place] [address] ; Phone: [phone]. POC Lorelei  Iron [phone],fax [phone]. The director request the medical information not be faxed until Monday because she is out of the office till then. This Clinical research associate spoke with Ms Iron and hear her state the above.    Family Interview Note: Pt declined family interview at this time.

## 2015-06-22 NOTE — Plan of Care (Signed)
[name] woke up at lunch time and has been awake since then. Took a shower and attended to ADLs. Voiced withdrawal symptoms. Scores were 10-14, 8-10 and 10-9, see flow sheet for details. Suboxone 2mg  administered at 1207 and 1604 and Phenobarbital 32.4mg  administered at 1305 and 1810. Patient with good appetite, in good spirits. Mingling with peers on the unit. Encouraged to work on aftercare. RN will continue to monitor and support.

## 2015-06-22 NOTE — Progress Notes (Signed)
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 folic acid  1 mg Oral Daily   . lamoTRIgine  100 mg Oral Daily   . multivitamin  1 tablet Oral Daily   . nicotine  1 patch Transdermal Daily   . QUEtiapine  200 mg Oral QHS   . silver sulfADIAZINE   Topical Daily   . thiamine  100 mg Oral Daily   . tuberculin  0.1 mL Intradermal Once   . venlafaxine  150 mg Oral Daily       Review of Systems:   A comprehensive review of systems was negative except for:   Respiratory: Negative  Cardiovascular: Negative  Gastrointestinal: Negative  Genitourinary: Negative  Musculoskeletal: Negative  Neurological: positive for tremors and weakness    Physical Exam:     Filed Vitals:    06/22/15 0412   BP: 103/64   Pulse: 74   Temp:    Resp: 16   SpO2:        General appearance - oriented to person, place, and time  Mental status -  anxious   Eyes - pupils equal and reactive, extraocular eye movements intact  Ears - bilateral TM's and external ear canals normal  Nose - no septal perforation  Mouth - mucous membranes moist, pharynx normal without lesions  Neck - supple, no significant adenopathy  Chest - clear to auscultation, no wheezes, rales or rhonchi, symmetric air entry  Heart - normal rate, regular rhythm, normal S1, S2, no murmurs, rubs, clicks or gallops  Abdomen - soft, nontender, nondistended, no masses or organomegaly  Rectal - deferred to PCP.  Neurological - alert, oriented, normal speech,  no focal findings or movement disorder noted, screening mental status exam normal, neck supple without rigidity, cranial nerves II through XII intact, motor and sensory grossly normal bilaterally, normal muscle tone, mild tremors, strength 5/5  Musculoskeletal - no joint tenderness, deformity or swelling  Extremities - peripheral pulses normal, no pedal edema, no clubbing or cyanosis  Skin - normal      Labs:     Results     Procedure Component Value Units Date/Time    Hepatitis C (HCV) antibody, Total [[id number]]  (Abnormal) Collected:  06/21/15 0553    Specimen Information:  Blood Updated:  06/21/15 1053     Hepatitis C, AB Reactive (A)     HIV Ag/Ab 4th generation [[id number]] Collected:  06/21/15 0553     HIV Ag/Ab, 4th Generation Non-Reactive Updated:  06/21/15 1015    Hepatitis B (HBV) Surface Antigen [[id number]] Collected:  06/21/15 0553    Specimen Information:  Blood Updated:  06/21/15 0929     Hepatitis B Surface AG Non-Reactive     Beta HCG, Quant, Serum [[id number]] Collected:  06/21/15 0553  hCG, Quant. <1.2 Updated:  06/21/15 0925    Lipid panel [[id number]]  (Abnormal) Collected:  06/21/15 0553    Specimen Information:  Blood Updated:  06/21/15 0907     Cholesterol 178 mg/dL      Triglycerides 86 mg/dL      HDL 51 mg/dL      LDL Calculated 409 (H) mg/dL      VLDL Cholesterol Cal 17 mg/dL      CHOL/HDL Ratio 3.5     Hemolysis index [[id number]] Collected:  06/21/15 0553     Hemolysis Index 0 Updated:  06/21/15 0907    GGT [[id number]]  (Abnormal) Collected:  06/21/15 0553     Gamma Gluten Transferase 45 (H) U/L Updated:  06/21/15 0906    RPR (Reflex to Titer and Confirmation) [[id number]] Collected:  06/21/15 0553     Updated:  06/21/15 5284              Signed by: Nicholaus Bloom, MD

## 2015-06-22 NOTE — Plan of Care (Signed)
Problem: Safe medical management of withdrawal from (list substances of abuse) AS EVIDENCED BY...  Goal: Compliance with management of withdrawal syndrome  Intervention: Assess withdrawal signs/symptoms according to identified protocol e.g. CIWA/COWS  [name] was assessed q2h for withdrawal s/sx per protocol day 1. Pt was medicated with Suboxone 2 mg at 21:42 for withdrawal symptoms of restlessness, sweats, tremors, body ache, runny nose, stomach cramps, irritability with good effect. Administered scheduled Seroquel 200 mg at 21:42 for insomnia. Pt is pleasant and cooperative with care. Visible on the unit milieu. She didn't participate in Family gourp meeting but participated in NA meeting this evening. Administered Phenobarbital 32.4 mg at 00:12 for withdrawal symptoms of tremor, sweats, anxiety, and agitation with good effect. VSS. Pt is currently in bed sleeping. RN will continue to monitor and maintain pt safety.

## 2015-06-23 NOTE — Progress Notes (Signed)
At 1650 is very irritable and demand medication immediately.  Vital reassessed BP 134/83, pulse 85  Patient medicated with Suboxone 2 mg SL at 1653.  Continue plan of care. (ma)

## 2015-06-23 NOTE — Progress Notes (Signed)
Patient is very hyperverbal and loud and shows anger behavior toward her nurse and other staff members (techs). She is very argumentive and behaving is not in proper conduct toward her nurse on several occasions.   Also she's been confrontable, intrusive and does not allow privacy to other patient. She interrupted other patient care because she wants her medications to be given immediately. Her behavior is not appropriate with her age. She has very poor bindery, self-control and poor communication skills.  Unit supervisor, Production designer, theatre/television/film and counselor are informed. (ma)

## 2015-06-23 NOTE — Progress Notes (Signed)
PROGRESS NOTE    Date Time: 06/23/2015 7:45 AM  Patient Name: [name],[name]      Chief Complaint:   Anxious  Sweats  Fatigue  On Suboxone taper  Needs to detox prior to Residential Transfer  May need Bed to Bed  Transfer        Assessment:   Opiate Use Disorder  Opiate Withdrawal  Anxiety  Sedative Misuse  Anxiety  HCV by history with [facility]        Plan:   Continue with Detoxification  Suboxone Detoxification  SA Counseling  Discussed with treatment team  Review labs with patients  IOP Triage  Discharge planning  GI REFERRALS FOR HCV GIVEN             Medications:     Current Facility-Administered Medications   Medication Dose Route Frequency   . folic acid  1 mg Oral Daily   . lamoTRIgine  100 mg Oral Daily   . multivitamin  1 tablet Oral Daily   . nicotine  1 patch Transdermal Daily   . QUEtiapine  200 mg Oral QHS   . silver sulfADIAZINE   Topical Daily   . thiamine  100 mg Oral Daily   . tuberculin  0.1 mL Intradermal Once   . venlafaxine  150 mg Oral Daily       Review of Systems:   A comprehensive review of systems was negative except for:   Respiratory: Negative  Cardiovascular: Negative  Gastrointestinal: Negative  Genitourinary: Negative  Musculoskeletal: Negative  Neurological: positive for tremors and weakness    Physical Exam:     Filed Vitals:    06/23/15 0417   BP: 96/54   Pulse: 80   Temp:    Resp: 17   SpO2:        General appearance - oriented to person, place, and time  Mental status -  anxious   Eyes - pupils equal and reactive, extraocular eye movements intact  Nose - no septal perforation  Mouth - mucous membranes moist, pharynx normal without lesions  Neck - supple, no significant adenopathy  Chest - clear to auscultation, no wheezes, rales or rhonchi, symmetric air entry  Heart - normal rate, regular rhythm, normal S1, S2, no murmurs, rubs, clicks or gallops  Abdomen - soft, nontender, nondistended, no masses or organomegaly  Neurological - alert, oriented, normal speech, no focal  findings or movement disorder noted, screening mental status exam normal, neck supple without rigidity, cranial nerves II through XII intact, motor and sensory grossly normal bilaterally, normal muscle tone, mild tremors  Musculoskeletal - no joint tenderness, deformity or swelling  Extremities - peripheral pulses normal, no pedal edema, no clubbing or cyanosis  Skin - normal      Labs:     Results     Procedure Component Value Units Date/Time    HEP C (HCV) Viral Load, Quant, PCR [[id number]]  (Abnormal) Collected:  06/21/15 0553    Specimen Information:  Blood Updated:  06/22/15 1403     HCV RNA, PCR, Quant. [id number] (H)      HCV RNA, PCR, Quant 5.89 (H)     RPR (Reflex to Titer and Confirmation) [[id number]] Collected:  06/21/15 0553     RPR Non Reactive Updated:  06/22/15 1020              Signed by: Nicholaus Bloom, MD

## 2015-06-23 NOTE — Plan of Care (Signed)
Problem: Safe medical management of withdrawal from (list substances of abuse) AS EVIDENCED BY...  Goal: Safe detoxification/education about relapse prevention/engagement in discharge planning  Outcome: Progressing  Patient remains on Suboxone 2 mg QID PRN Protocol and Phenobarbital 32.4 mg Q 4 hours PRN Protocol and scored 4/4, 10/9 and 4/4 at 2013, 2111 and 0012 consecutively on the CIWA/COWS, with symptoms indicative of tremors, sweats, anxiety, restlessness, irritability.  Patient utilized Soboxone 2 mg at 2122 .  Otherwise, enjoyed a calm and comfortable night of sleep without any incidents.  Staff monitoring for safety and support is ongoing and Patient is able to maintain safety.

## 2015-06-23 NOTE — Progress Notes (Signed)
Patient spend all morning in her bed sleeping. She refused to get up or to take schedule medications.  Patient denies discomfort and requested no PRN medications.  Continue to monitor.(ma)

## 2015-06-23 NOTE — Progress Notes (Signed)
Counseling Note:  This counselor met pt at bedside to discuss and confirm aftercare plan. Pt  refused to get out of bed stating, "I'm not getting out of bed," despite  request from this counselor. Pt was notified about her discharge date as 06/24/2015, and that she'll not be confirmed for transition to [facility] until Monday, 06/26/15. Pt said, "okay" and turned her back towards this Counselor. Pt presented with erratic behavior with agitation, irritability, defensive with struggle to be cooperative and not concerned about discharge plans. Pt also demanded to this writer to have medications brought into her room as she's not getting out of bed.     Pt was not visible in the milieu. Pt was observed isolating self in her room all day.     Transition Planning Note:  This counselor attempted to discuss and explore aftercare plan following discharge. Pt was notified that she'll be discharged on 02/25 and her aftercare plan to [facility] will not be reviewed by POC: director, Lorelei Iron until Monday, 02/27. Pt refused to participate despite this counselor's attempt to assist.      Treatment Plan Reassessment Note:  Pt refused to meet 1:1 to review treatment plan objectives.   Group Attendance: Pt did not attend groups and meetings as scheduled.    Task Assignment: Pt refused to work on the assignment provided.     Aftercare Plan: Unsure as pt refuses to participate in aftercare plan as pt's paperwork for [facility] will not be reviewed until Monday, 06/26/15.

## 2015-06-24 MED ORDER — CLOTRIMAZOLE 1 % VA CREA
TOPICAL_CREAM | Freq: Every evening | VAGINAL | Status: DC
Start: 2015-06-24 — End: 2015-06-26
  Filled 2015-06-24: qty 45

## 2015-06-24 NOTE — Plan of Care (Signed)
Problem: Safe medical management of withdrawal from (list substances of abuse) AS EVIDENCED BY...  Goal: Safe detoxification/education about relapse prevention/engagement in discharge planning  Outcome: Progressing  Patient remains on Suboxone 2 mg Taper and Phenobarbital 32.4 mg Q 4 hours PRN Protocol, and scored 9/9, 10/9, 6/6 and 4/5 at 1955, 2122, 0011 and 0407 consecutively on the CIWA/COWS scale, with symptoms indicative of restlessness, anxiety, sweats, mild agitation and irritability.  Patient utilized PRN Suboxone 2 mg at 2126 and Phenobarbital 32.4 mg at 2020.  Otherwise, remains very pleasant upon approach and slept calmly and comfortably without any incidents.b Staff monitoring for safety and support is ongoing and Patient is able to maintain safety.

## 2015-06-24 NOTE — Progress Notes (Signed)
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 folic acid  1 mg Oral Daily   . lamoTRIgine  100 mg Oral Daily   . multivitamin  1 tablet Oral Daily   . nicotine  1 patch Transdermal Daily   . QUEtiapine  200 mg Oral QHS   . silver sulfADIAZINE   Topical Daily   . thiamine  100 mg Oral Daily   . tuberculin  0.1 mL Intradermal Once   . venlafaxine  150 mg Oral Daily       Review of Systems:   A comprehensive review of systems was negative except for:   Respiratory: Negative  Cardiovascular: Negative  Gastrointestinal: Negative  Genitourinary: Negative  Musculoskeletal: Negative  Neurological: positive for tremors and weakness    Physical Exam:     Filed Vitals:    06/24/15 0407   BP: 92/51   Pulse: 86   Temp:    Resp: 17   SpO2:        General appearance - oriented to person, place, and time  Mental status -  anxious   Eyes - pupils equal and reactive, extraocular eye movements intact  Nose - no septal perforation  Mouth - mucous membranes moist, pharynx normal without lesions  Neck - supple, no significant adenopathy  Chest - clear to auscultation, no wheezes, rales or rhonchi, symmetric air entry  Heart - normal rate, regular rhythm, normal S1, S2, no murmurs, rubs, clicks or gallops  Abdomen - soft, nontender, nondistended, no masses or organomegaly  Neurological - alert, oriented, normal speech,   cranial nerves II through XII intact, motor and sensory grossly normal bilaterally, normal muscle tone, mild tremors  Musculoskeletal - no joint tenderness, deformity or swelling  Extremities - peripheral pulses normal, no pedal edema, no clubbing or cyanosis  Skin - normal      Labs:     Results     ** No results found for the last 24 hours. **              Signed by: Nicholaus Bloom, MD

## 2015-06-24 NOTE — Plan of Care (Signed)
Problem: Safe medical management of withdrawal from (list substances of abuse) AS EVIDENCED BY...  Goal: Compliance with management of withdrawal syndrome  Reassessed at 1526 for symptome of withdrawal. VS are stable with CIWA/COW score of 6/0. She slept from 1350 up to diner time. Reported feeling better with good appetite. Is visible on the unit after diner interacting and playing games with peers. Denied SI/HI or intent to hurt self or to hurt others. Will continue to assess and medicate per protocol.  Jamison Neighbor, MSN

## 2015-06-24 NOTE — Plan of Care (Signed)
Assessed for symptom of opiate/sedative withdrawal. VS are stable with CIWA/COW sore of 2/1, 9/1 4/5 and 91. Reported moderate anxiety, restlessness, depressed, angry,  Irritable, isolative and agitated at time when her tray from the cafeteria had watery jello. Reported having some personal and family problems but did not want to discuss them with her nurse. Denied SI/HI or intent to hurt self or to hurt others. Denied symptom of opiate withdrawal during this shift but was cooperative with meds. Stated she is titrating from suboxone and does not want to take it anymore. Medicated with phenobarbital 32.4 mg at 0854 and 1345 with good effect. Discussed the importance of discussing personal and family issues with care providers. Will continue to assess and medicate per protocol.  Lanier Prude, RN, MSN

## 2015-06-24 NOTE — Plan of Care (Signed)
Problem: Safe medical management of withdrawal from (list substances of abuse) AS EVIDENCED BY...  Goal: Compliance with management of withdrawal syndrome  Outcome: Progressing  Patient's assessment done at the beginning of the shift:  Remains visible in the mellieu, interacting and socializing with selected peers.  Noted to be somehow loud, with periodic irritability.  Did not attend any of the organized Unit group activities.  However, remains cooperative and compliant with treatment and treatment medications.  Will continue to monitor.  Staff monitoring for safety and support is ongoing and Patient is able to maintain safety.

## 2015-06-24 NOTE — Progress Notes (Signed)
Counseling Note:  Counselor and this pt met 1:1 this morning to check in and follow up on her aftercare plan. Pt presented with guardedness but was cooperative and reported compliance with tx regimen as recommended. Pt was observed awake in the morning, eating breakfast in group room, and waiting for morning medications to be administered. Pt reports her discharge plan remains the same as [employer] [employer] in [place], [place]. Pt agreed to explore more options available in [place] and [place] with this Clinical research associate.     Pt did not attend any groups scheduled for today. Pt was observed isolating and sleeping in her room throughout the day. Pt was observed in the milieu only during meal times and bathroom breaks.     Transition Planning Note:  Pt's aftercare plan is bed to bed transfer from inpatient to [facility], House [address] [place],  [place], [place] [address] ; Phone: [phone]. POC Lorelei Iron [phone],fax [phone]. Pt admission hasn't been confirmed as of yet. Pt's medical papers will be faxed on Monday, 06/26/2015 as requested by POC at [facility]. Pt was also provided resources listed below to explore availability as 2nd option.     Living in Recovery  [facility] [address], [place],  [place] [address]  EDalene Carrow Phone: [phone]        [facility] [address]  [place], [place] [address] [phone]   [facility] [address]  [place], [place] [address] [phone]   [facility]  [facility]   [address]  [place], [place] [address]   Hours of Operation:  Open 24 hrs, 7 days a week  [email or web address]  Ph: [phone]  Fax: [phone]          Sober living Housing in [facility] in [place], [place] [phone]    [facility] [phone]    [facility] [phone], [place]  [phone]    [facility] [place] [phone]

## 2015-06-25 MED ORDER — FLUCONAZOLE 100 MG PO TABS
150.0000 mg | ORAL_TABLET | Freq: Once | ORAL | Status: AC
Start: 2015-06-25 — End: 2015-06-25
  Administered 2015-06-25: 150 mg via ORAL
  Filled 2015-06-25: qty 2

## 2015-06-25 NOTE — Progress Notes (Signed)
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 clotrimazole   Vaginal QHS   . folic acid  1 mg Oral Daily   . lamoTRIgine  100 mg Oral Daily   . multivitamin  1 tablet Oral Daily   . nicotine  1 patch Transdermal Daily   . QUEtiapine  200 mg Oral QHS   . silver sulfADIAZINE   Topical Daily   . thiamine  100 mg Oral Daily   . tuberculin  0.1 mL Intradermal Once   . venlafaxine  150 mg Oral Daily       Review of Systems:   A comprehensive review of systems was negative except for:   Respiratory: Negative  Cardiovascular: Negative  Gastrointestinal: Negative  Genitourinary: Negative  Musculoskeletal: Negative  Neurological: positive for tremors and weakness    Physical Exam:     Filed Vitals:    06/25/15 0624   BP: 114/60   Pulse: 77   Temp:    Resp: 16   SpO2:        General appearance - oriented to person, place, and time  Mental status -  anxious   Eyes - pupils equal and reactive, extraocular eye movements intact  Nose - no septal perforation  Mouth - mucous membranes moist, pharynx normal without lesions  Neck - supple, no significant adenopathy  Chest - clear to auscultation, no wheezes, rales or rhonchi, symmetric air entry  Heart - normal rate, regular rhythm, normal S1, S2, no murmurs, rubs, clicks or gallops  Abdomen - soft, nontender, nondistended, no masses or organomegaly  Neurological -  alert, oriented, normal speech,  cranial nerves II through XII intact,  normal muscle tone, mild tremors  Musculoskeletal - no joint tenderness, deformity or swelling  Extremities - peripheral pulses normal, no pedal edema, no clubbing or cyanosis  Skin - normal      Labs:     Results     ** No results found for the last 24 hours. **              Signed by: Nicholaus Bloom, MD

## 2015-06-25 NOTE — Discharge Summary (Signed)
DISCHARGE SUMMARY    Date Time:   06/26/2015   11.05 am  Patient Name: JoanJoan [name]  Attending Physician: Nicholaus Bloom, MD    Date of Admission:   06/20/2015    Date of Discharge:   06/26/2015    Reason for Admission:   Abnormal clinical finding [R68.89]    Discharge Dx:   Opiate Use Disorder  Opiate Withdrawal  Anxiety  Sedative Use Disorder      Hospital Course:   Patient was admitted to detoxification.  She elected to detoxification with Suboxone.  She planned on Residential program.  She may consider Naltrexone.  She need close follow up.  Prognosis is guarded.      Physical Examination:   General appearance - oriented to person, place, and time   Mental status -  Anxious mood, guarded  Eyes - pupils equal and reactive, extraocular eye movements intact  Ears - bilateral TM's and external ear canals normal  Nose - no septal perforation  Mouth - mucous membranes moist, pharynx normal without lesions  Neck - supple, no significant adenopathy  Chest - clear to auscultation, no wheezes, rales or rhonchi, symmetric air entry  Heart - normal rate, regular rhythm, normal S1, S2, no murmurs, rubs, clicks or gallops  Abdomen - soft, nontender, nondistended, no masses or organomegaly  Neurological - alert, oriented, normal speech, no focal findings or movement disorder noted, screening mental status exam normal, neck supple without rigidity, cranial nerves II through XII intact, motor and sensory grossly normal bilaterally, normal muscle tone, mild tremors, strength 5/5  Musculoskeletal - no joint tenderness, deformity or swelling  Extremities - peripheral pulses normal, no pedal edema, no clubbing or cyanosis  Skin - no cellulitis    Clinical Diagnosis:    Axis I: Sedative dependence, Anxiety disorder, unspecified anxiety disorder type, Depressive disorder and Opioid dependence with withdrawal   Axis EX:HBZJI   Axis III: HCV   Axis IV: Homelessness   Axis V:41-50: serious symptoms.: Highest in the last year  60   No significant change        Discharge Medications:     Current Discharge Medication List      START taking these medications    Details   hydrOXYzine (VISTARIL) 25 MG capsule Take 1 capsule (25 mg total) by mouth every 6 (six) hours as needed for Anxiety.  Qty: 15 capsule, Refills: 0    Associated Diagnoses: Abnormal clinical finding      nicotine (NICODERM CQ) 21 MG/24HR Place 1 patch onto the skin daily.  Qty: 28 patch, Refills: 0    Associated Diagnoses: Abnormal clinical finding         CONTINUE these medications which have CHANGED    Details   lamoTRIgine (LAMICTAL) 100 MG tablet Take 1 tablet (100 mg total) by mouth daily.  Qty: 15 tablet, Refills: 0      QUEtiapine (SEROQUEL) 200 MG tablet Take 1 tablet (200 mg total) by mouth nightly.  Qty: 15 tablet, Refills: 0      venlafaxine (EFFEXOR-XR) 150 MG 24 hr capsule Take 1 capsule (150 mg total) by mouth daily.  Qty: 15 capsule, Refills: 0               Discharge Instructions:   Discharge to RESIDENTIAL PROGRAM.  SA Counseling  AA/NA  Follow up with PCP  Smoking Cessation Rx/Counseling provided.      Signed by: Nicholaus Bloom, MD      Time:  29 mins.

## 2015-06-25 NOTE — Plan of Care (Signed)
[name] is being assessed for symptom of opiate/sedative withdrawal. VS are stable with CIWA/COW score of 8/2. Reported moderate anxiety that is not going away, restlessness and mild sweats and tremors. Medicated with phenobarbital 32. 4 mg at 0900 with good effect. She is up and about pacing at time, Is interacting with peers and attending selected unit activities. Denies SI/HI or intent to hurt self or to hurt others. Educated about the effect of alcohol/substance abuse/dependency on health and alternative coping strategies. Verbalized understanding. Will continue to assess, educate and medicate per protocol.  Lanier Prude, RN, MSN

## 2015-06-25 NOTE — Plan of Care (Signed)
Pt refused to participate in groups stating that she is too depressed to participate. Pt was observed socializing and laughing in the hallway with another patient who also refused to participate in groups. Pt is engaged and actively participating in the unit milieu. Pt is compliant with the treatment plan, plan of care and all medication. Pt presented to the nurse station at approximately 2030 requesting medication for withdrawal symptoms of anxiety. Pt was assessed to be anxious, irritable, depressed, agitated, restless and slightly tremulous. Administered PRN Suboxone 2 mg 1 film @ 2032 for withdrawal symptoms and anxiety. Pt later presented to the nurse's station @ approximately 2130 requesting Phenobarbital for anxiety & withdrawal symptoms. Pt was assessed to be irritable, anxious, tremulous, restless and agitated. Administered PRN Phenobarbital 32.4 mg for withdrawal symptoms and anxiety @ 2132 with effectiveness. Pt received scheduled Seroquel 200 mg @ 2134 for sleep. Pt is currently sleeping in bed in bedroom peacefully. No complaints of pain or discomfort. VSS, WNL. No apparent or respiratory distress. Will continue care plan & continue to monitor & medicate as necessary per MD orders.

## 2015-06-25 NOTE — Plan of Care (Signed)
Pt slept well throughout the entire night. No complaints of pain or discomfort. No apparent or respiratory distress. VSS, WNL. Pt is currently in bed in bedroom sleeping peacefully. Will continue care plan & continue to monitor & medicate as necessary per MD orders.

## 2015-06-25 NOTE — Progress Notes (Signed)
Counseling Note: Pt did not attend community meeting this morning. She attended the feelings group this morning. Pt and this Clinical research associate met today for 1:1 session to discuss treatment progress and goals. Pt appeared agitated and annoyed as evidenced by her being extremely short with this Clinical research associate when this Clinical research associate attempted to engage her in session. This counselor tried to engage pt in discussion re: her aftercare plan and pt stated she already had an aftercare plan. She noted that she planned to transfer to sober living at [facility] in [place], [place]. Pt and this writer reviewed her treatment plan; which she and this Clinical research associate initialed. Pt was encouraged to work on task assignment and attend the afternoon groups; which she stated she would consider.     Transitional Planning Note: Pt reported she plans to transfer to sober living at [facility] in [place], [place].

## 2015-06-25 NOTE — Plan of Care (Signed)
Problem: Safe medical management of withdrawal from (list substances of abuse) AS EVIDENCED BY...  Goal: Safe detoxification/education about relapse prevention/engagement in discharge planning  Reassessed at 1615  And 1707 for symptom of opiate/sedative withdrawal. Continues to report moderate anxiety, restlessness and agitation every four hours. Her VS are stable with CIWA/COW score of 3/3. Medicated with phenobarbital 32. 4 mg at 1721 with good effect. Is visible on the unit interacting with peers and attending selected group. Will continue to assess, educate and medicate per protocol.  Lanier Prude, RN, MSN

## 2015-06-26 NOTE — Progress Notes (Signed)
[facility] Nursing Discharge and Collaborative Plan       Date:  06/26/2015  Time: 10:19 AM  Patient Name:  [name]  25 y.o.   March 20, 1991      Date of Admission:  06/20/2015  Discharge Physician: Simmie Davies, MD     Labs:     Lab Results   Component Value Date    ALT 172* 06/20/2015    WBC 8.50 06/20/2015    RBC 4.69 06/20/2015    HGB 13.5 06/20/2015    HCT 41.3 06/20/2015    MCV 88.1 06/20/2015    PLT 405* 06/20/2015    AST 92* 06/20/2015    GGT 45* 06/21/2015    ALB 4.0 06/20/2015    RPR Non Reactive 06/21/2015     Vaccination Results:  There is no immunization history for the selected administration types on file for this patient.  Pneumococcal:  Not Indicated  Influenza:  Not Indicated  TB Test:  Not given    Discharged Medication(s):        Ona, Roehrs   Home Medication Instructions RUE:[id number]    Printed on:06/26/15 1019   Medication Information                      hydrOXYzine (VISTARIL) 25 MG capsule  Take 1 capsule (25 mg total) by mouth every 6 (six) hours as needed for Anxiety.             lamoTRIgine (LAMICTAL) 100 MG tablet  Take 1 tablet (100 mg total) by mouth daily.             nicotine (NICODERM CQ) 21 MG/24HR  Place 1 patch onto the skin daily.             QUEtiapine (SEROQUEL) 200 MG tablet  Take 1 tablet (200 mg total) by mouth nightly.             venlafaxine (EFFEXOR-XR) 150 MG 24 hr capsule  Take 1 capsule (150 mg total) by mouth daily.                 Disposition/aftercare/Referral Plan:     Follow-up plan for sobriety was discussed with the patient.    Discharge Instructions:     Questions that may arise between hospital discharge and your first follow-up appointment should be directed to Inpatient [facility] [phone].    In event that you have a seizure upon discharge please call 911 and/or proceed to the nearest hospital.    Discharge Plan:     Referrals:  Comments   Continued Care Program  Phone #: [phone]  yes [facility] ([facility])  Bed to bed       TOPIC INSTRUCTION METHOD OUTCOME COMMENTS   DIET General Discussion Learned Eat a well balanced diet.   SAFETY Potential Seizures  HIV risk (used needles) Discussion Learned If you have any problems call 911 or go to an emergency room.       Patient Signature_____________________________ date 06/26/2015  10:24 AM      Nurse Signature________________________________date 06/26/2015 10:24 AM

## 2015-06-26 NOTE — Plan of Care (Signed)
Pt slept well throughout the entire night. No complaints of pain or discomfort. No apparent or respiratory distress. VSS, WNL. Pt is currently in bed in bedroom sleeping peacefully. Will continue care plan & continue to monitor & medicate as necessary per MD orders.

## 2015-06-26 NOTE — Plan of Care (Signed)
Pt was observed participating in all groups & was observed socializing with other patients on the unit. Pt is engaged and actively participating in the unit milieu. Pt is compliant with the treatment plan, plan of care and all medication. Pt presented to the nurse station at approximately 2151 requesting Phenobarbital for anxiety and withdrawal symptoms. Pt was assessed to be anxious, irritable, agitated and restless. Administered PRN Phenobarbital 32.4 mg @ 2151 for anxiety and withdrawal symptoms. Pt also received Seroquel  200 mg for sleep at that time, with effectiveness. Pt is currently in bedroom sleeping peacefully. No complaints of pain or discomfort. VSS, WNL. No apparent or respiratory distress. Will continue care plan & continue to monitor & medicate as necessary per MD orders.

## 2015-06-26 NOTE — Progress Notes (Signed)
Staff went into pt's room around 0900 for assessment. She was in her room, sleeping comfortably. Breathing with in normal range. Staff tried to wake her up. She didn't response to staff's question. Her room mate reported she just fell asleep. Morning vitals BP 112/55 mmHg  Pulse 75  Temp(Src) 97.3 F (36.3 C) (Oral)  Resp 16. Scheduled medication on hold. Will try to assess pt again and coordinate with treatment team.

## 2015-06-26 NOTE — Plan of Care (Signed)
Problem: Safe medical management of withdrawal from (list substances of abuse) AS EVIDENCED BY...  Goal: Compliance with management of withdrawal syndrome  Intervention: Assess withdrawal signs/symptoms according to identified protocol e.g. CIWA/COWS  Bita woke up around 1045. She reported her boyfriend "Jonny Ruiz" was in lobby to pick her up. She requested for suboxone as she c/o sweats, tremors, anxiety and body aches. CIWA/COWS - 6/10. suboxone 2 mg sl and scheduled medications given. She was discharged from the unit around 1105.

## 2015-06-26 NOTE — Progress Notes (Signed)
Discharge note    [name] was discharged from the unit around 1105. She left with her boyfriend to sober living facility " [facility]". She is going bed to bed. Education was given on prescriptions- vistaril 25 mg as needed for anxiety, Lamictal 100 mg daily, nicotine patch, seroquel 200 mg PO at bed time and effexor 150 daily. She reported understanding related to instructions. She denied SI and HI. Belongings were returned back. Prescriptions and copy of nursing summary was given.

## 2015-06-26 NOTE — Progress Notes (Signed)
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 clotrimazole   Vaginal QHS   . folic acid  1 mg Oral Daily   . lamoTRIgine  100 mg Oral Daily   . multivitamin  1 tablet Oral Daily   . nicotine  1 patch Transdermal Daily   . QUEtiapine  200 mg Oral QHS   . silver sulfADIAZINE   Topical Daily   . thiamine  100 mg Oral Daily   . tuberculin  0.1 mL Intradermal Once   . venlafaxine  150 mg Oral Daily       Review of Systems:   A comprehensive review of systems was negative except for:   Respiratory: Negative  Cardiovascular: Negative  Gastrointestinal: Negative  Genitourinary: Negative  Musculoskeletal: Negative  Neurological: positive for tremors and weakness    Physical Exam:     Filed Vitals:    06/26/15 0407   BP: 114/57   Pulse: 67   Temp:    Resp: 16   SpO2:        General appearance - oriented to person, place, and time  Mental status -  anxious   Eyes - pupils equal and reactive, extraocular eye movements intact  Nose - no septal perforation  Mouth - mucous membranes moist, pharynx normal without lesions  Neck - supple, no significant adenopathy  Chest - clear to auscultation, no wheezes, rales or rhonchi, symmetric air entry  Heart - normal rate, regular rhythm, normal S1, S2, no murmurs, rubs, clicks or gallops  Abdomen - soft, nontender, nondistended, no masses or organomegaly  Neurological - alert,  oriented, normal speech,  cranial nerves II through XII intact,  normal muscle tone, mild tremors  Musculoskeletal - no joint tenderness, deformity or swelling  Extremities - peripheral pulses normal, no pedal edema, no clubbing or cyanosis  Skin - normal      Labs:     Results     ** No results found for the last 24 hours. **              Signed by: Nicholaus Bloom, MD

## 2015-06-26 NOTE — Discharge Summary -  Counselor (Signed)
[facility]   Discharge Summary    Patient Name: [name], [name]  Date of Birth: Sep 07, 1990  Medical Record Number: [id number]  Level of Care: Inpatient  Admission Date: 06/20/2015.  Unit Discharge Date: 06/26/2015   Reason For Discharge: Treatment Completed    Initial Justification for Treatment:   Kayline Sheer is a 25 y.o. single Caucasian female presenting with diagnosis of Opiate abuse and treatment. This was patient's first treatment episode at [facility] and Twenty treatment episode overall. Patient reports the Presenting Problem: pe Patient Stated Goal: "just get clean, rest and work on myself"    Detailed Drug Use History  Any current alcohol and/or drug use?: Yes  Reason for Alcohol or Drug Use: I like it  Alcohol Use: In Lifetime, In Past Twelve Months   Age first used alcohol: : 41   Max used in a day: 24 beers   Pattern of use:: denies current use; regular use in college and right after and at age 80   Last use:: month ago  Tranquilizers/Benzodiazepines:: xanax-unprescribed   Age first used:: 55   Years of use:: 1 year   Max used in a day: 3 bars   Pattern of use:: current use is 1-2 bars every couple days   Last use:: 2 days ago on 06/19/2015  Tranquilizers/Benzodiazepines #2:: ativan, valium, librium; I have tried them all  Tranquilizers/Benzodiazepines #3: she has tried [place] 1-2 weeks ago  Heroin Use: In Lifetime, IV, Nasal, Smoked   Age first used heroin:: 21   Years of use:: 3 years   Max used in a day: 5 grams   Pattern of use:: she was using IV 1-2 grams a day   Last use:: 2 weeks prior  Rx Opiates #1: : percocets-unprescribed   Age first used:: 89   Years of use:: 0   Pattern of use:: occasional use   Last use:: week and a half ago  Rx Opiates #2: : suboxone/subutex-unprescribed   Age first used:: 40   Years of use::  pasdt 2 weeks   Max used in a day: 16 mg   Pattern of use:: she tried to take 16 mg a day if she can find it; she has also injected it; she says she uses it to keep from getting sick   Last use:: 06/19/2015  Rx Opiates #3: : n/a  Opiates Use #4:: n/a  Opiates Use #5:: n/a  Opiates Use #6:: n/a  Non-Rx Methadone Use: N/A  Amphetamine Use: In Lifetime, Smoked, IV   Age first used amphetamine:: 16   Years of use:: 0   Pattern of use:: she has used meth IV and adderal   Last use:: 7 months ago  Marijuana Use: In Lifetime, Smoked   Age first used Marijuana:: 14   Years of use:: teen through college   Pattern of use:: rare use now   Last use:: 2,5 weeks ago  Cocaine Use: In Lifetime, Nasal, IV, Smoked   Age first used cocaine:: 18   Years of use:: off and on since age 70   Pattern of use:: current use is random   Last use:: 5 days ago she used IV and smoked crack  Hallucinogens Use: In Lifetime, Oral (mushrooms x 1 at age 67)  Inhalants Use: N/A  PCP Use: N/A  Nicotine Use: In Lifetime, Smoked   Age first used nicotine:: 14   Years of use:: 10 years   Pattern of use:: 1/2 ppd   Last use:: PTA   Longest  Abstinence: : when pregnant and breast feeding  Barbiturates Use: N/A  Other Substance Abuse: Molly every day for 2 months in 2016  Other Substance Abuse #2:: n/a  Other Substance Abuse #3:: n/a  Caffeine use:: she drinks 3 Red Bulls a day.     Current Withdrawal Symptoms: Anxiety, Chills, Sweats, Headache, Restless Legs, Tremors, Fatigue, Nausea/vomiting, Tearing, Yawning, Other (see comment) (cravings)  Past Withdrawal Symptoms: Anxiety, Irritability, Nausea/vomiting, Runny nose, Tearing, Yawning, Tremors, Sweats, Restless Legs, Insomnia, Chills, Fatigue, Headache, Joint/Bodyaches, Cravings, Depression  History of Blackouts?: Yes  History of  Withdrawal Seizures?: No    Summary of Significant Findings:  Upon discharge, patient does not report SI/HI.Patient reports current living situation is homeless living in her boyfriend car and that she lives with self. Patient reports this is a sober environment. Patient reports she does not currently have a sponsor. Patient reports she participates in play sport and listen to music for leisure activities. Patient describes current social relationships as good. Patient denies legal history. Patient reports highest Education/Highest Grade Completed: bachelors degree in nursing . Patient is Currently unemployed. Patient does report job consequences as a result of her substance abuse. Patient does report financial problems currently. Patient reports medical health issues. Patient reports last time she saw her medical provider was about in December 2016.      Summary of Services Provided:   Pt. was detoxified with Suboxone protocol. Additional medical therapy included Phenobarbital, Seroquel. Pt. was individually assessed and treatment plans were developed. Pt. had been offered education on the disease process of addition, relapse behaviors, self-defeating learned behaviors, defense mechanisms and post acute withdrawal symptoms. Pt. was directed to attend all 12-step meetings and educational groups/lectures. Patient began inpatient level of care on 06/20/2015 11:22 PM. Patient was referred to this level of care by the ED.  Patient made the following progress towards meeting treatment goals: pt declined group attendance, socialized only during meals as slept during morning shift. Pt declined completing assignments. Was disruptive and inappropriate to staff as pt presented help rejecting. Did not participate in her aftercare planning & staff expedited phone contact and transmittal of medical records.  Patient's behavior in group was difficult to assess as pt reportedly attended one group & attempted to engage in power  struggle with staff . Pt's aftercare plan is sober living in [place], [place].  Patient did involve her family in treatment as staff spoke with pt's mother.      Patient presented in the a pre-contemplative stage of change as evidenced by pt did not take personal responsibility for her addiction and is entering sober living due to external pressure.     Dischrge Diagnosis:  See H&P  Condition at discharge  Pt was medically stabilized at time of discharge    Continuing Care Plan:   Continue attending 12-step meetings obtain sponsor  Continue to take all medications, as directed  Follow up with [facility] on 06/25/25 @ 1700 hours

## 2015-06-26 NOTE — Progress Notes (Signed)
Transition Note: Pt. was detoxified with Suboxone protocol. Additional medical therapy included Phenobarbital, Seroquel. Pt. was individually assessed and treatment plans were developed. Pt. had been offered education on the disease process of addition, relapse behaviors, self-defeating learned behaviors, defense mechanisms and post acute withdrawal symptoms. Pt. was directed to attend all 12-step meetings and educational groups/lectures. Patient began inpatient level of care on 06/20/2015 11:22 PM. Patient was referred to this level of care by the ED. Patient made the following progress towards meeting treatment goals: pt declined group attendance, socialized only during meals as slept during morning shift. Pt declined completing assignments. Was disruptive and inappropriate to staff as pt presented help rejecting. Did not participate in her aftercare planning & staff expedited phone contact and transmittal of medical records. Patient's behavior in group was difficult to assess as pt reportedly attended one group & attempted to engage in power struggle with staff . Pt's aftercare plan is sober living in [place], [place]. Patient did involve her family in treatment as staff spoke with pt's mother.     Patient presented in the a pre-contemplative stage of change as evidenced by pt did not take personal responsibility for her addiction and is entering sober living due to external pressure.    Medical records received at [facility] by Janeice Robinson.

## 2019-04-30 NOTE — L&D Delivery Note (Signed)
Delivery Note:   [id number] at [redacted]w[redacted]d  Admitting diagnosis: [redacted] weeks gestation of pregnancy [Z3A.39] Risks: HCV +, history of anxiety/depression, history of drug abuse, COVID-19 07/2019 Onset of labor: 01/28/2020 @ 1319 IOL/Augmentation: AROM, Pitocin and IP Foley ROM: 01/28/2020 @ 1319  Complete dilation at 01/28/2020  1649 Onset of pushing at 1650 FHR second stage Cat 2, variables with pushing  Analgesia /Anesthesia intrapartum:Epidural  Pushing in side lying position with CNM and L&D staff support, mother, [name], and sister, [name], present for birth and supportive.  Delivery of a Live born female  Birth Weight:  3456g, 7lb 9.9oz APGAR: 9, 9   Newborn Delivery   Birth date/time: 01/28/2020 17:06:00 Delivery type: Vaginal, Spontaneous      in cephalic presentation, position ROA to ROT.  APGAR:1 min-9 , 5 min-9   Nuchal Cord: No  Cord double clamped after cessation of pulsation, cut by sister, Allie.  Collection of cord blood for typing completed. Cord blood donation- No Arterial cord blood sample-No    Placenta delivered-Spontaneous  with 3 vessels . Uterotonics: Pitocin Placenta to L&D. Uterine tone firm bleeding stable  None  laceration identified.  Episiotomy:None  Local analgesia: N/A  Repair: N/A Est. Blood Loss (mL):50.00   Complications: None  Mom to postpartum.  Baby Britta Mccreedy to Couplet care / Skin to Skin.  Delivery Report:   Review the Delivery Report for details.    Signed: Clancy Gourd, MSN 01/28/2020, 10:04 PM

## 2019-07-19 LAB — OB RESULTS CONSOLE HEPATITIS B SURFACE ANTIGEN: Hepatitis B Surface Ag: NEGATIVE

## 2019-07-19 LAB — OB RESULTS CONSOLE RUBELLA ANTIBODY, IGM: Rubella: IMMUNE

## 2019-07-19 LAB — OB RESULTS CONSOLE ABO/RH: RH Type: POSITIVE

## 2019-07-19 LAB — OB RESULTS CONSOLE HIV ANTIBODY (ROUTINE TESTING): HIV: NONREACTIVE

## 2019-07-19 LAB — OB RESULTS CONSOLE ANTIBODY SCREEN: Antibody Screen: NEGATIVE

## 2019-07-19 LAB — OB RESULTS CONSOLE RPR: RPR: NONREACTIVE

## 2019-08-16 ENCOUNTER — Other Ambulatory Visit: Payer: Self-pay

## 2019-08-16 ENCOUNTER — Encounter (HOSPITAL_COMMUNITY): Payer: Self-pay | Admitting: Emergency Medicine

## 2019-08-16 ENCOUNTER — Emergency Department (HOSPITAL_COMMUNITY)
Admission: EM | Admit: 2019-08-16 | Discharge: 2019-08-16 | Disposition: A | Discharge status: Home / Self Care | Payer: Medicaid Other | Attending: Emergency Medicine | Admitting: Emergency Medicine

## 2019-08-16 DIAGNOSIS — R05 Cough: Secondary | ICD-10-CM

## 2019-08-16 DIAGNOSIS — U071 COVID-19: Secondary | ICD-10-CM | POA: Diagnosis not present

## 2019-08-16 DIAGNOSIS — R059 Cough, unspecified: Secondary | ICD-10-CM

## 2019-08-16 HISTORY — DX: Inflammatory liver disease, unspecified: K75.9

## 2019-08-16 LAB — CBC
HCT: 37.6 % (ref 36.0–46.0)
Hemoglobin: 12.3 g/dL (ref 12.0–15.0)
MCH: 29.1 pg (ref 26.0–34.0)
MCHC: 32.7 g/dL (ref 30.0–36.0)
MCV: 89.1 fL (ref 80.0–100.0)
Platelets: 215 10*3/uL (ref 150–400)
RBC: 4.22 MIL/uL (ref 3.87–5.11)
RDW: 13.3 % (ref 11.5–15.5)
WBC: 5.1 10*3/uL (ref 4.0–10.5)
nRBC: 0 % (ref 0.0–0.2)

## 2019-08-16 LAB — BASIC METABOLIC PANEL
Anion gap: 8 (ref 5–15)
BUN: 7 mg/dL (ref 6–20)
CO2: 24 mmol/L (ref 22–32)
Calcium: 9.1 mg/dL (ref 8.9–10.3)
Chloride: 104 mmol/L (ref 98–111)
Creatinine, Ser: 0.55 mg/dL (ref 0.44–1.00)
GFR calc Af Amer: >60 mL/min (ref >60)
GFR calc non Af Amer: >60 mL/min (ref >60)
Glucose, Bld: 99 mg/dL (ref 70–99)
Potassium: 3.6 mmol/L (ref 3.5–5.1)
Sodium: 136 mmol/L (ref 135–145)

## 2019-08-16 NOTE — ED Notes (Signed)
Patient verbalizes understanding of discharge instructions. Opportunity for questioning and answers were provided. Armband removed by staff, pt discharged from ED ambulatory.   

## 2019-08-16 NOTE — Discharge Instructions (Addendum)
As we discussed please continue to do what you are doing.  Please continue to monitor your symptoms.  You may buy a pulse oximeter and if you have consistently low numbers close to 90-93 you should be reevaluated in the emergency department or by your primary care doctor.  You should also return to ED for any new or concerning symptoms or if he would like to be evaluated.  I am prescribing you phenergan if you have any nausea you may use this as it is very important that extended eat.  If you are unable to take this medication orally you may use it as a suppository in place inside your rectum.

## 2019-08-16 NOTE — ED Triage Notes (Signed)
Patient reports testing positive for Covid last Monday. Since then has continued to have worsening cough and no relief from symptoms from tylenol or mucinex.

## 2019-08-16 NOTE — ED Provider Notes (Signed)
[facility] EMERGENCY DEPARTMENT Provider Note   CSN: [id number] Arrival date & time: 08/16/19  1616     History Chief Complaint  Patient presents with  . Cough    Covid +    Kristin Vaughn is a 29 y.o. female.  HPI  Patient is 16-week pregnant 29 year old female with a history of hepatitis and very remote IV drug use presented today with body aches, cough, and fatigue which has been ongoing since she tested positive for Covid on Monday.  She states that she woke up with symptoms got tested and was told she was positive.  She states that she has not had worsening symptoms and denies shortness of breath however states that she just feels less energetic than usual.  She states that she sees OB/GYN regularly.  She has had good prenatal care and is currently taking prenatal vitamins that include vitamin C.  She takes no other medications  Patient denies any history of DVT/VTE/PE, denies any history of cancer, denies any chest pain but does state that it feels uncomfortable when she takes a deep breath.  She has no heart palpitations.    Patient states that she did have a fever at oine  point of 101 however she states that it completely resolved with Tylenol.    Past Medical History:  Diagnosis Date  . Hepatitis     There are no problems to display for this patient.   Past Surgical History:  Procedure Laterality Date  . BREAST SURGERY       OB History    Gravida  1   Para      Term      Preterm      AB      Living        SAB      TAB      Ectopic      Multiple      Live Births              No family history on file.  Social History   Tobacco Use  . Smoking status: Not on file  Substance Use Topics  . Alcohol use: Not on file  . Drug use: Not on file    Home Medications Prior to Admission medications   Not on File    Allergies    Patient has no known allergies.  Review of Systems   Review of Systems   Constitutional: Positive for fatigue and fever. Negative for chills.  HENT: Negative for congestion.   Eyes: Negative for pain.  Respiratory: Positive for cough. Negative for shortness of breath.   Cardiovascular: Negative for chest pain and leg swelling.  Gastrointestinal: Negative for abdominal pain and vomiting.  Genitourinary: Negative for dysuria.  Musculoskeletal: Positive for myalgias.  Skin: Negative for rash.  Neurological: Negative for dizziness and headaches.    Physical Exam Updated Vital Signs BP 115/77 (BP Location: Left Arm)   Pulse 96   Temp 98.4 F (36.9 C) (Oral)   Resp 18   Ht 5\' 6"  (1.676 m)   Wt 65.8 kg   SpO2 99%   BMI 23.40 kg/m   Physical Exam Vitals and nursing note reviewed.  Constitutional:      General: She is not in acute distress.    Appearance: Normal appearance. She is normal weight. She is ill-appearing (Patient is mildly ill-appearing and fatigued). She is not toxic-appearing or diaphoretic.  HENT:     Head: Normocephalic and atraumatic.  Nose: Nose normal.     Mouth/Throat:     Mouth: Mucous membranes are moist.  Eyes:     General: No scleral icterus. Cardiovascular:     Rate and Rhythm: Normal rate and regular rhythm.     Pulses: Normal pulses.     Heart sounds: Normal heart sounds.     Comments: Heart rate 94 on my exam Pulmonary:     Effort: Pulmonary effort is normal. No respiratory distress.     Breath sounds: No wheezing.     Comments: Lungs are clear to auscultation bilaterally.  SPO2 100% on room air  Speaking in full sentences without difficulty. Abdominal:     Palpations: Abdomen is soft.     Tenderness: There is no abdominal tenderness. There is no guarding or rebound.     Comments: Pregnant abdomen.  Nontender to palpation.  No CVA tenderness.  Musculoskeletal:     Cervical back: Normal range of motion.     Right lower leg: No edema.     Left lower leg: No edema.     Comments: Strength grossly within normal  limits.  Skin:    General: Skin is warm and dry.     Capillary Refill: Capillary refill takes less than 2 seconds.  Neurological:     Mental Status: She is alert. Mental status is at baseline.  Psychiatric:        Mood and Affect: Mood normal.        Behavior: Behavior normal.     ED Results / Procedures / Treatments   Labs (all labs ordered are listed, but only abnormal results are displayed) Labs Reviewed  CBC  BASIC METABOLIC PANEL    EKG None  Radiology No results found.  Procedures Procedures (including critical care time)  Medications Ordered in ED Medications - No data to display  ED Course  I have reviewed the triage vital signs and the nursing notes.  Pertinent labs & imaging results that were available during my care of the patient were reviewed by me and considered in my medical decision making (see chart for details).    MDM Rules/Calculators/A&P                       Patient is 29 year old female with no pertinent past medical history other than IV drug use and currently pregnant at [redacted] weeks.  She is present today with cough, fevers, fatigue.  She states that she does positive for Covid on Monday.  She denies any pleuritic chest pain, is not hypoxic on exam, not tachycardic, has no unilateral leg swelling or edema.  Is otherwise well-appearing and has notably normal chest exam.  I specifically doubt pulmonary embolism and discussed with patient my concern for this and low suspicion for this.  She is agreeable to deferring lab work and x-ray at this time to further evaluate her shortness of breath as we again explanation of this given that she has Covid.  She has had no double sickening or improvement and then reworsening that would indicate a bacterial superinfection.  Given that she is pregnant I believe that watchful waiting is the best plan for this patient at this time.  I discussed this plan with the patient she is agreeable to this.  She would prefer  not to have any additional imaging done given that this would cause exposure to radiation for the patient's baby.  She is PERC negative   Although pregnancy makes this risk assessment  tool less applicable.  I gave patient strict return precautions and she states she will follow up very closely with her PCP/OB/GYN and be in close contact with them for recommendations.  She will take vitamin C, vitamin D, zinc.  She will continue take her prenatal vitamins.   Kristin Vaughn was evaluated in Emergency Department on 08/16/2019 for the symptoms described in the history of present illness. She was evaluated in the context of the global COVID-19 pandemic, which necessitated consideration that the patient might be at risk for infection with the SARS-CoV-2 virus that causes COVID-19. Institutional protocols and algorithms that pertain to the evaluation of patients at risk for COVID-19 are in a state of rapid change based on information released by regulatory bodies including the CDC and federal and state organizations. These policies and algorithms were followed during the patient's care in the ED.   Final Clinical Impression(s) / ED Diagnoses Final diagnoses:  COVID-19  Cough    Rx / DC Orders ED Discharge Orders    None       Tedd Sias, [place] 08/16/19 2207    Little, Wenda Overland, MD 08/16/19 2308

## 2020-01-08 LAB — OB RESULTS CONSOLE GBS: GBS: NEGATIVE

## 2020-01-25 ENCOUNTER — Other Ambulatory Visit: Payer: Self-pay | Admitting: Obstetrics & Gynecology

## 2020-01-25 ENCOUNTER — Telehealth (HOSPITAL_COMMUNITY): Payer: Self-pay | Admitting: *Deleted

## 2020-01-25 ENCOUNTER — Encounter (HOSPITAL_COMMUNITY): Payer: Self-pay | Admitting: *Deleted

## 2020-01-25 NOTE — Telephone Encounter (Signed)
Preadmission screen  

## 2020-01-26 ENCOUNTER — Other Ambulatory Visit (HOSPITAL_COMMUNITY)
Admission: RE | Admit: 2020-01-26 | Discharge: 2020-01-26 | Disposition: A | Discharge status: Home / Self Care | Payer: Medicaid Other | Source: Ambulatory Visit | Attending: Obstetrics & Gynecology | Admitting: Obstetrics & Gynecology

## 2020-01-26 DIAGNOSIS — Z20822 Contact with and (suspected) exposure to covid-19: Secondary | ICD-10-CM | POA: Diagnosis present

## 2020-01-26 LAB — SARS CORONAVIRUS 2 (TAT 6-24 HRS): SARS Coronavirus 2: NEGATIVE

## 2020-01-28 ENCOUNTER — Encounter (HOSPITAL_COMMUNITY): Payer: Self-pay | Admitting: Obstetrics & Gynecology

## 2020-01-28 ENCOUNTER — Other Ambulatory Visit: Payer: Self-pay

## 2020-01-28 ENCOUNTER — Inpatient Hospital Stay (HOSPITAL_COMMUNITY): Payer: Medicaid Other | Admitting: Anesthesiology

## 2020-01-28 ENCOUNTER — Inpatient Hospital Stay (HOSPITAL_COMMUNITY)
Admission: AD | Admit: 2020-01-28 | Discharge: 2020-01-29 | DRG: 807 | Disposition: A | Discharge status: Home / Self Care | Payer: Medicaid Other | Attending: Obstetrics & Gynecology | Admitting: Obstetrics & Gynecology

## 2020-01-28 ENCOUNTER — Inpatient Hospital Stay (HOSPITAL_COMMUNITY): Payer: Medicaid Other

## 2020-01-28 DIAGNOSIS — B1920 Unspecified viral hepatitis C without hepatic coma: Secondary | ICD-10-CM | POA: Diagnosis present

## 2020-01-28 DIAGNOSIS — O99344 Other mental disorders complicating childbirth: Secondary | ICD-10-CM | POA: Diagnosis present

## 2020-01-28 DIAGNOSIS — B182 Chronic viral hepatitis C: Secondary | ICD-10-CM | POA: Diagnosis present

## 2020-01-28 DIAGNOSIS — Z3A39 39 weeks gestation of pregnancy: Secondary | ICD-10-CM

## 2020-01-28 DIAGNOSIS — O26893 Other specified pregnancy related conditions, third trimester: Secondary | ICD-10-CM | POA: Diagnosis present

## 2020-01-28 DIAGNOSIS — F32A Depression, unspecified: Secondary | ICD-10-CM | POA: Diagnosis present

## 2020-01-28 DIAGNOSIS — Z8616 Personal history of COVID-19: Secondary | ICD-10-CM

## 2020-01-28 DIAGNOSIS — Z8659 Personal history of other mental and behavioral disorders: Secondary | ICD-10-CM

## 2020-01-28 DIAGNOSIS — Z3490 Encounter for supervision of normal pregnancy, unspecified, unspecified trimester: Secondary | ICD-10-CM | POA: Diagnosis present

## 2020-01-28 DIAGNOSIS — O9842 Viral hepatitis complicating childbirth: Secondary | ICD-10-CM | POA: Diagnosis present

## 2020-01-28 LAB — CBC
HCT: 36 % (ref 36.0–46.0)
Hemoglobin: 12.2 g/dL (ref 12.0–15.0)
MCH: 30.4 pg (ref 26.0–34.0)
MCHC: 33.9 g/dL (ref 30.0–36.0)
MCV: 89.8 fL (ref 80.0–100.0)
Platelets: 224 10*3/uL (ref 150–400)
RBC: 4.01 MIL/uL (ref 3.87–5.11)
RDW: 13.4 % (ref 11.5–15.5)
WBC: 9.8 10*3/uL (ref 4.0–10.5)
nRBC: 0 % (ref 0.0–0.2)

## 2020-01-28 LAB — RPR: RPR Ser Ql: NONREACTIVE

## 2020-01-28 LAB — TYPE AND SCREEN
ABO/RH(D): O POS
Antibody Screen: NEGATIVE

## 2020-01-28 MED ORDER — DIPHENHYDRAMINE HCL 50 MG/ML IJ SOLN: 12.5000 mg | INTRAMUSCULAR | Status: DC | PRN

## 2020-01-28 MED ORDER — ACETAMINOPHEN 325 MG PO TABS: 650.0000 mg | ORAL_TABLET | ORAL | Status: DC | PRN

## 2020-01-28 MED ORDER — FENTANYL-BUPIVACAINE-NACL 0.5-0.125-0.9 MG/250ML-% EP SOLN
12.0000 mL/h | EPIDURAL | Status: DC | PRN
  Filled 2020-01-28: qty 250

## 2020-01-28 MED ORDER — DIPHENHYDRAMINE HCL 25 MG PO CAPS
50.0000 mg | ORAL_CAPSULE | Freq: Every evening | ORAL | Status: DC | PRN
  Filled 2020-01-28: qty 2

## 2020-01-28 MED ORDER — SOD CITRATE-CITRIC ACID 500-334 MG/5ML PO SOLN: 30.0000 mL | ORAL | Status: DC | PRN

## 2020-01-28 MED ORDER — SIMETHICONE 80 MG PO CHEW: 80.0000 mg | CHEWABLE_TABLET | ORAL | Status: DC | PRN

## 2020-01-28 MED ORDER — LACTATED RINGERS IV SOLN: INTRAVENOUS | Status: DC

## 2020-01-28 MED ORDER — SENNOSIDES-DOCUSATE SODIUM 8.6-50 MG PO TABS
2.0000 | ORAL_TABLET | ORAL | Status: DC
  Administered 2020-01-28: 2 via ORAL
  Filled 2020-01-28: qty 2

## 2020-01-28 MED ORDER — SERTRALINE HCL 50 MG PO TABS
50.0000 mg | ORAL_TABLET | Freq: Every day | ORAL | Status: DC
  Administered 2020-01-28 – 2020-01-29 (×2): 50 mg via ORAL
  Filled 2020-01-28 (×2): qty 1

## 2020-01-28 MED ORDER — ONDANSETRON HCL 4 MG PO TABS: 4.0000 mg | ORAL_TABLET | ORAL | Status: DC | PRN

## 2020-01-28 MED ORDER — IBUPROFEN 600 MG PO TABS
600.0000 mg | ORAL_TABLET | Freq: Four times a day (QID) | ORAL | Status: DC
  Administered 2020-01-28 – 2020-01-29 (×4): 600 mg via ORAL
  Filled 2020-01-28 (×4): qty 1

## 2020-01-28 MED ORDER — DIPHENHYDRAMINE HCL 25 MG PO CAPS
25.0000 mg | ORAL_CAPSULE | Freq: Four times a day (QID) | ORAL | Status: DC | PRN
  Administered 2020-01-28: 25 mg via ORAL

## 2020-01-28 MED ORDER — LACTATED RINGERS IV SOLN: 500.0000 mL | INTRAVENOUS | Status: DC | PRN

## 2020-01-28 MED ORDER — WITCH HAZEL-GLYCERIN EX PADS: 1.0000 "application " | MEDICATED_PAD | CUTANEOUS | Status: DC | PRN

## 2020-01-28 MED ORDER — TETANUS-DIPHTH-ACELL PERTUSSIS 5-2.5-18.5 LF-MCG/0.5 IM SUSP: 0.5000 mL | Freq: Once | INTRAMUSCULAR | Status: DC

## 2020-01-28 MED ORDER — ZOLPIDEM TARTRATE 5 MG PO TABS: 5.0000 mg | ORAL_TABLET | Freq: Every evening | ORAL | Status: DC | PRN

## 2020-01-28 MED ORDER — OXYTOCIN-SODIUM CHLORIDE 30-0.9 UT/500ML-% IV SOLN: 2.5000 [IU]/h | INTRAVENOUS | Status: DC

## 2020-01-28 MED ORDER — PHENYLEPHRINE 40 MCG/ML (10ML) SYRINGE FOR IV PUSH (FOR BLOOD PRESSURE SUPPORT)
80.0000 ug | PREFILLED_SYRINGE | INTRAVENOUS | Status: DC | PRN
  Filled 2020-01-28: qty 10

## 2020-01-28 MED ORDER — DIBUCAINE (PERIANAL) 1 % EX OINT: 1.0000 "application " | TOPICAL_OINTMENT | CUTANEOUS | Status: DC | PRN

## 2020-01-28 MED ORDER — FENTANYL CITRATE (PF) 2500 MCG/50ML IJ SOLN
INTRAMUSCULAR | Status: DC | PRN
Start: 2020-01-28 — End: 2020-01-28
  Administered 2020-01-28: 12 mL/h via EPIDURAL

## 2020-01-28 MED ORDER — ONDANSETRON HCL 4 MG/2ML IJ SOLN: 4.0000 mg | Freq: Four times a day (QID) | INTRAMUSCULAR | Status: DC | PRN

## 2020-01-28 MED ORDER — LIDOCAINE HCL (PF) 1 % IJ SOLN
INTRAMUSCULAR | Status: DC | PRN
  Administered 2020-01-28: 11 mL via EPIDURAL

## 2020-01-28 MED ORDER — LIDOCAINE HCL (PF) 1 % IJ SOLN: 30.0000 mL | INTRAMUSCULAR | Status: DC | PRN

## 2020-01-28 MED ORDER — PRENATAL MULTIVITAMIN CH
1.0000 | ORAL_TABLET | Freq: Every day | ORAL | Status: DC
  Administered 2020-01-29: 1 via ORAL
  Filled 2020-01-28: qty 1

## 2020-01-28 MED ORDER — BENZOCAINE-MENTHOL 20-0.5 % EX AERO: 1.0000 "application " | INHALATION_SPRAY | CUTANEOUS | Status: DC | PRN

## 2020-01-28 MED ORDER — EPHEDRINE 5 MG/ML INJ: 10.0000 mg | INTRAVENOUS | Status: DC | PRN

## 2020-01-28 MED ORDER — ONDANSETRON HCL 4 MG/2ML IJ SOLN: 4.0000 mg | INTRAMUSCULAR | Status: DC | PRN

## 2020-01-28 MED ORDER — FENTANYL CITRATE (PF) 100 MCG/2ML IJ SOLN: 50.0000 ug | INTRAMUSCULAR | Status: DC | PRN

## 2020-01-28 MED ORDER — PHENYLEPHRINE 40 MCG/ML (10ML) SYRINGE FOR IV PUSH (FOR BLOOD PRESSURE SUPPORT)
80.0000 ug | PREFILLED_SYRINGE | INTRAVENOUS | Status: DC | PRN
  Administered 2020-01-28: 80 ug via INTRAVENOUS

## 2020-01-28 MED ORDER — COCONUT OIL OIL: 1.0000 "application " | TOPICAL_OIL | Status: DC | PRN

## 2020-01-28 MED ORDER — OXYTOCIN BOLUS FROM INFUSION: 333.0000 mL | Freq: Once | INTRAVENOUS | Status: DC

## 2020-01-28 MED ORDER — LACTATED RINGERS IV SOLN: 500.0000 mL | Freq: Once | INTRAVENOUS | Status: DC

## 2020-01-28 MED ORDER — TERBUTALINE SULFATE 1 MG/ML IJ SOLN: 0.2500 mg | Freq: Once | INTRAMUSCULAR | Status: DC | PRN

## 2020-01-28 MED ORDER — OXYTOCIN-SODIUM CHLORIDE 30-0.9 UT/500ML-% IV SOLN
1.0000 m[IU]/min | INTRAVENOUS | Status: DC
  Administered 2020-01-28: 2 m[IU]/min via INTRAVENOUS
  Filled 2020-01-28: qty 500

## 2020-01-28 NOTE — Plan of Care (Signed)
Kristin Vaughn 

## 2020-01-28 NOTE — Progress Notes (Signed)
S: Doing well, states pain with contractions is considerably less now that foley balloon is out. Discussed the R/B/A of AROM and patient consents to procedure. Mother, [name], and sister, [name] at the bedside providing support.   O: Vitals:   01/28/20 1207 01/28/20 1307 01/28/20 1344 01/28/20 1348  BP: 102/71 123/83  136/76  Pulse: 79 65  (!) 53  Resp: 18 18 16 18   Temp:    97.7 F (36.5 C)  Weight:      Height:       FHT:  FHR: 135 bpm, variability: moderate,  accelerations:  Present,  decelerations:  Absent UC:   irregular, every 2-3 minutes SVE:   Dilation: 4 Effacement (%): 60 Station: -2 Exam by:: Boleslaus Holloway, CNM   AROM of clear fluid @ 1319  A / P: Induction of labor due to term with favorable cervix,  progressing well on pitocin, s/p foley balloon for cervical ripening  Fetal Wellbeing:  Category I Pain Control:  Labor support without medications Anticipated MOD:  NSVD   Pt may have epidural upon request. Will continue to increase Pitocin until adequate contractions are achieved. Plan SVE at 1700.  [phone], CNM, MSN 01/28/2020, 2:51 PM

## 2020-01-28 NOTE — Anesthesia Procedure Notes (Signed)
Epidural Patient location during procedure: OB Start time: 01/28/2020 2:55 PM End time: 01/28/2020 3:07 PM  Staffing Anesthesiologist: Lowella Curb, MD Performed: anesthesiologist   Preanesthetic Checklist Completed: patient identified, IV checked, site marked, risks and benefits discussed, surgical consent, monitors and equipment checked, pre-op evaluation and timeout performed  Epidural Patient position: sitting Prep: ChloraPrep Patient monitoring: heart rate, cardiac monitor, continuous pulse ox and blood pressure Approach: midline Location: L2-L3 Injection technique: LOR saline  Needle:  Needle type: Tuohy  Needle gauge: 17 G Needle length: 9 cm Needle insertion depth: 5 cm Catheter type: closed end flexible Catheter size: 20 Guage Catheter at skin depth: 9 cm Test dose: negative  Assessment Events: blood not aspirated, injection not painful, no injection resistance, no paresthesia and negative IV test  Additional Notes Reason for block:procedure for pain
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
## 2020-01-28 NOTE — Anesthesia Preprocedure Evaluation (Signed)
Anesthesia Evaluation  Patient identified by MRN, date of birth, ID band Patient awake    Reviewed: Allergy & Precautions, NPO status , Patient's Chart, lab work & pertinent test results  Airway Mallampati: II  TM Distance: >3 FB Neck ROM: Full    Dental no notable dental hx.    Pulmonary neg pulmonary ROS,    Pulmonary exam normal breath sounds clear to auscultation       Cardiovascular negative cardio ROS Normal cardiovascular exam Rhythm:Regular Rate:Normal     Neuro/Psych negative neurological ROS  negative psych ROS   GI/Hepatic negative GI ROS, (+) Hepatitis -, C  Endo/Other  negative endocrine ROS  Renal/GU negative Renal ROS  negative genitourinary   Musculoskeletal negative musculoskeletal ROS (+)   Abdominal   Peds negative pediatric ROS (+)  Hematology negative hematology ROS (+)   Anesthesia Other Findings   Reproductive/Obstetrics (+) Pregnancy                             Anesthesia Physical Anesthesia Plan  ASA: II  Anesthesia Plan: Epidural   Post-op Pain Management:    Induction:   PONV Risk Score and Plan:   Airway Management Planned:   Additional Equipment:   Intra-op Plan:   Post-operative Plan:   Informed Consent:   Plan Discussed with:   Anesthesia Plan Comments:         Anesthesia Quick Evaluation

## 2020-01-29 LAB — CBC
HCT: 33.7 % — ABNORMAL LOW (ref 36.0–46.0)
Hemoglobin: 11 g/dL — ABNORMAL LOW (ref 12.0–15.0)
MCH: 29 pg (ref 26.0–34.0)
MCHC: 32.6 g/dL (ref 30.0–36.0)
MCV: 88.9 fL (ref 80.0–100.0)
Platelets: 198 10*3/uL (ref 150–400)
RBC: 3.79 MIL/uL — ABNORMAL LOW (ref 3.87–5.11)
RDW: 13.4 % (ref 11.5–15.5)
WBC: 13.7 10*3/uL — ABNORMAL HIGH (ref 4.0–10.5)
nRBC: 0 % (ref 0.0–0.2)

## 2020-01-29 MED ORDER — IBUPROFEN 600 MG PO TABS
600.0000 mg | ORAL_TABLET | Freq: Four times a day (QID) | ORAL | 0 refills | Status: DC
Start: 2020-01-29 — End: 2021-02-13

## 2020-01-29 MED ORDER — SERTRALINE HCL 50 MG PO TABS: 50.0000 mg | ORAL_TABLET | Freq: Every day | ORAL | 1 refills | Status: AC

## 2020-01-29 MED ORDER — IBUPROFEN 600 MG PO TABS
600.0000 mg | ORAL_TABLET | Freq: Four times a day (QID) | ORAL | 0 refills | Status: DC
Start: 2020-01-29 — End: 2020-01-29

## 2020-01-29 MED ORDER — SERTRALINE HCL 50 MG PO TABS
50.0000 mg | ORAL_TABLET | Freq: Every day | ORAL | 1 refills | Status: DC
Start: 2020-01-30 — End: 2020-01-29

## 2020-01-29 NOTE — Lactation Note (Signed)
This note was copied from a baby's chart. Lactation Consultation Note  Patient Name: Kristin Vaughn Date: 01/29/2020 Reason for consult: Initial assessment;Difficult latch;Breast augmentation  LC to room for initial consult. Mom and baby to d/c today; d/c ed provided. Assisted with positioning and observed RSS. Answered all questions, including r/t Hep C med. Referred to [facility], 2021. Mom aware of community resources available d/c. Maternal Data Does the patient have breastfeeding experience prior to this delivery?: No  Feeding Feeding Type: Breast Fed  LATCH Score Latch: Repeated attempts needed to sustain latch, nipple held in mouth throughout feeding, stimulation needed to elicit sucking reflex.  Audible Swallowing: A few with stimulation  Type of Nipple: Everted at rest and after stimulation  Comfort (Breast/Nipple): Soft / non-tender  Hold (Positioning): Assistance needed to correctly position infant at breast and maintain latch.  LATCH Score: 7  Interventions Interventions: Breast feeding basics reviewed;Assisted with latch;Adjust position;Support pillows;Position options  Lactation Tools Discussed/Used     Consult Status Consult Status: Complete    Elder Negus 01/29/2020, 11:18 AM
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
## 2020-01-29 NOTE — Anesthesia Postprocedure Evaluation (Signed)
Anesthesia Post Note  Patient: Darianne Muralles  Procedure(s) Performed: AN AD HOC LABOR EPIDURAL     Patient location during evaluation: Mother Baby Anesthesia Type: Epidural Level of consciousness: awake, awake and alert and oriented Pain management: pain level controlled Vital Signs Assessment: post-procedure vital signs reviewed and stable Respiratory status: spontaneous breathing, nonlabored ventilation and respiratory function stable Cardiovascular status: stable Postop Assessment: no headache, patient able to bend at knees, no apparent nausea or vomiting, adequate PO intake and able to ambulate Anesthetic complications: no   No complications documented.  Last Vitals:  Vitals:   01/29/20 0330 01/29/20 0842  BP: 120/88 121/80  Pulse: (!) 52 63  Resp: 18 16  Temp: 36.6 C 36.8 C  SpO2: 99% 100%    Last Pain:  Vitals:   01/29/20 0842  TempSrc: Oral  PainSc: 2    Pain Goal: Patients Stated Pain Goal: 2 (01/29/20 2536)              Epidural/Spinal Function Cutaneous sensation: Normal sensation (01/29/20 0842), Patient able to flex knees: Yes (01/29/20 0842), Patient able to lift hips off bed: Yes (01/29/20 0842), Back pain beyond tenderness at insertion site: No (01/29/20 0842), Progressively worsening motor and/or sensory loss: No (01/29/20 0842), Bowel and/or bladder incontinence post epidural: No (01/29/20 0842)  Sophiana Milanese
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
## 2020-02-11 ENCOUNTER — Telehealth: Payer: Self-pay

## 2020-02-11 NOTE — Telephone Encounter (Signed)
[facility] Patient Product/process development scientist completed.    The patient is insured through [facility] [place].   Insurance will pay for MAVYRET with a PA .  We will continue to follow to see if copay assistance is needed.  Clearance Coots, CPhT Specialty Pharmacy Patient [facility] for Infectious Disease Phone: [phone] Fax:  [phone]

## 2020-02-14 ENCOUNTER — Encounter: Payer: Self-pay | Admitting: Internal Medicine

## 2020-02-14 ENCOUNTER — Ambulatory Visit (INDEPENDENT_AMBULATORY_CARE_PROVIDER_SITE_OTHER): Payer: Medicaid Other | Admitting: Internal Medicine

## 2020-02-14 ENCOUNTER — Other Ambulatory Visit: Payer: Self-pay

## 2020-02-14 VITALS — BP 132/82 | HR 77 | Wt 164.0 lb

## 2020-02-14 DIAGNOSIS — B182 Chronic viral hepatitis C: Secondary | ICD-10-CM | POA: Diagnosis present

## 2020-02-14 NOTE — Progress Notes (Signed)
[facility]   CC: consideration for treatment for chronic hepatitis C  HPI:  +Kristin Vaughn is a 29 y.o. female who presents for initial evaluation and management of chronic hepatitis C.  Patient tested positive hepatitis C Ab and RNA in May 2021. Hepatitis C-associated risk factors present are: tattoos. Patient denies none. Patient has had other studies performed. Results: hepatitis C RNA by PCR, result: positive. Patient has not had prior treatment for Hepatitis C. Patient does not have a past history of liver disease. Patient does not have a family history of liver disease. Patient does not  have associated signs or symptoms related to liver disease.  Labs reviewed and confirm chronic hepatitis C with a positive viral load.   Records reviewed from her CNM.      Patient does have documented immunity to Hepatitis A. Patient does not have documented immunity to Hepatitis B.    Review of Systems:  Constitutional: negative for fatigue and malaise Integument/breast: negative for rash Musculoskeletal: negative for myalgias and arthralgias All other systems reviewed and are negative       Past Medical History:  Diagnosis Date  . Hepatitis   . Vaginal Pap smear, abnormal     Prior to Admission medications   Medication Sig Start Date End Date Taking? Authorizing Provider  ibuprofen (ADVIL) 600 MG tablet Take 1 tablet (600 mg total) by mouth every 6 (six) hours. 01/29/20  Yes Montana, Jade, FNP  sertraline (ZOLOFT) 50 MG tablet Take 1 tablet (50 mg total) by mouth daily. 01/30/20  Yes Montana, Lesly Rubenstein, FNP    No Known Allergies  Social History   Tobacco Use  . Smoking status: Never Smoker  . Smokeless tobacco: Never Used  Substance Use Topics  . Alcohol use: Not Currently  . Drug use: Never   FMH: no history of HCC   Objective:  Constitutional: in no apparent distress,  Vitals:   02/14/20 0958  BP: 132/82  Pulse: 77  SpO2: 97%   Eyes:  anicteric Gastrointestinal: Bowel sounds are normal, liver is not enlarged, spleen is not enlarged Musculoskeletal: no pedal edema noted Skin: negatives: no rash; no porphyria cutanea tarda Lymphatic: no cervical lymphadenopathy   Laboratory Genotype: No results found for: HCVGENOTYPE HCV viral load: No results found for: HCVQUANT Lab Results  Component Value Date   WBC 13.7 (H) 01/29/2020   HGB 11.0 (L) 01/29/2020   HCT 33.7 (L) 01/29/2020   MCV 88.9 01/29/2020   PLT 198 01/29/2020    Lab Results  Component Value Date   CREATININE 0.55 08/16/2019   BUN 7 08/16/2019   NA 136 08/16/2019   K 3.6 08/16/2019   CL 104 08/16/2019   CO2 24 08/16/2019   No results found for: ALT, AST, GGT, ALKPHOS   Labs and history reviewed and show CHILD-PUGH unknown  5-6 points: Child class A 7-9 points: Child class B 10-15 points: Child class C  No results found for: INR, BILITOT, ALBUMIN   Assessment: New Patient with Chronic Hepatitis C genotype unknown, untreated.  I discussed with the patient the lab findings that confirm chronic hepatitis C as well as the natural history and progression of disease including about 30% of people who develop cirrhosis of the liver if left untreated and once cirrhosis is established there is a 2-7% risk per year of liver cancer and liver failure.  I discussed the importance of treatment and benefits in reducing the risk, even if significant liver fibrosis  exists.   Plan: 1) Patient counseled extensively on limiting acetaminophen to no more than 2 grams daily, avoidance of alcohol. 2) Transmission discussed with patient including sexual transmission, sharing razors and toothbrush.   3) Will need referral to gastroenterology if concern for cirrhosis 4) Will prescribe appropriate medication based on genotype and coverage  5) Hepatitis A and B titers 6) Further work up to include liver staging with Fibrosure  She is currently breastfeeding so will defer  treatment until she is no longer breastfeeding.  Will do the labs today though so she is ready to start at that time.  She also will go ahead and sign medication assistance forms today to be sure it is on file for when she is ready.  No ultrasound needed due to her age and low risk duration of infection.

## 2020-02-14 NOTE — Patient Instructions (Signed)
Date 02/14/20  Dear Ms Kristin Vaughn, As discussed in the ID Clinic, your hepatitis C therapy will include highly effective medication(s) for treatment and will vary based on the type of hepatitis C and insurance approval.  Potential medications include:          Harvoni (sofosbuvir 90mg /ledipasvir 400mg ) tablet oral daily          OR     Epclusa (sofosbuvir 400mg /velpatasvir 100mg ) tablet oral daily          OR      Mavyret (glecaprevir 100 mg/pibrentasvir 40 mg): Take 3 tablets oral daily                      Medications are typically for 8 or 12 weeks total ---------------------------------------------------------------- Your HCV Treatment Start Date: You will be notified by our office once the medication is approved and where you can pick it up (or if mailed)   ---------------------------------------------------------------- YOUR PHARMACY CONTACT: TBD   Please always contact your pharmacy at least 3-4 business days before you run out of medications to ensure your next month's medication is ready or 1 week prior to running out if you receive it by mail.  Remember, each prescription is for 28 days. ---------------------------------------------------------------- GENERAL NOTES REGARDING YOUR HEPATITIS C MEDICATION:  Some medications have the following interactions:  - Acid reducing agents such as H2 blockers (ie. Pepcid (famotidine), Zantac (ranitidine), Tagamet (cimetidine), Axid (nizatidine) and proton pump inhibitors (ie. Prilosec (omeprazole), Protonix (pantoprazole), Nexium (esomeprazole), or Aciphex (rabeprazole)). Do not take until you have discussed with a health care provider.    -Antacids that contain magnesium and/or aluminum hydroxide (ie. Milk of Magensia, Rolaids, Gaviscon, Maalox, Mylanta, an dArthritis Pain Formula).  -Calcium carbonate (calcium supplements or antacids such as Tums, Caltrate, Os-Cal).  -St. John's wort or any products that contain St. John's wort like some  herbal supplements  Please inform the office prior to starting any of these medications.  - The common side effects associated with Harvoni include:      1. Fatigue      2. Headache      3. Nausea      4. Diarrhea      5. Insomnia  Please note that this only lists the most common side effects and is NOT a comprehensive list of the potential side effects of these medications. For more information, please review the drug information sheets that come with your medication package from the pharmacy.  ---------------------------------------------------------------- GENERAL HELPFUL HINTS ON HCV THERAPY: 1. Stay well-hydrated. 2. Notify the ID Clinic of any changes in your other over-the-counter/herbal or prescription medications. 3. If you miss a dose of your medication, take the missed dose as soon as you remember. Return to your regular time/dose schedule the next day.  4.  Do not stop taking your medications without first talking with your healthcare provider. 5.  You may take Tylenol (acetaminophen), as long as the dose is less than 2000 mg (OR no more than 4 tablets of the Tylenol Extra Strengths 500mg  tablet) in 24 hours. 6.  You will see our pharmacist-specialist within the first 2 weeks of starting your medication to monitor for any possible side effects. 7.  You will have labs once during treatment, soon after treatment completion and one final lab 6 months after treatment completion to verify the virus is out of your system.  , MD  [facility] for Infectious Diseases [facility] [address]  [place], [place]  [address] [phone]

## 2020-02-19 LAB — CBC WITH DIFFERENTIAL/PLATELET
Absolute Monocytes: 328 cells/uL (ref 200–950)
Basophils Absolute: 42 cells/uL (ref 0–200)
Basophils Relative: 0.8 %
Eosinophils Absolute: 172 cells/uL (ref 15–500)
Eosinophils Relative: 3.3 %
HCT: 40.9 % (ref 35.0–45.0)
Hemoglobin: 13.9 g/dL (ref 11.7–15.5)
Lymphs Abs: 1659 cells/uL (ref 850–3900)
MCH: 29.6 pg (ref 27.0–33.0)
MCHC: 34 g/dL (ref 32.0–36.0)
MCV: 87.2 fL (ref 80.0–100.0)
MPV: 10.6 fL (ref 7.5–12.5)
Monocytes Relative: 6.3 %
Neutro Abs: 3000 cells/uL (ref 1500–7800)
Neutrophils Relative %: 57.7 %
Platelets: 449 10*3/uL — ABNORMAL HIGH (ref 140–400)
RBC: 4.69 10*6/uL (ref 3.80–5.10)
RDW: 12.2 % (ref 11.0–15.0)
Total Lymphocyte: 31.9 %
WBC: 5.2 10*3/uL (ref 3.8–10.8)

## 2020-02-19 LAB — HEPATITIS B SURFACE ANTIBODY,QUALITATIVE: Hep B S Ab: REACTIVE — AB

## 2020-02-19 LAB — HEPATITIS C RNA QUANTITATIVE
HCV RNA, PCR, QN (Log): 6.57 log IU/mL — ABNORMAL HIGH
HCV RNA, PCR, QN: 3730000 IU/mL — ABNORMAL HIGH

## 2020-02-19 LAB — LIVER FIBROSIS, FIBROTEST-ACTITEST
ALT: 26 U/L (ref 6–29)
Alpha-2-Macroglobulin: 337 mg/dL — ABNORMAL HIGH (ref 106–279)
Apolipoprotein A1: 165 mg/dL (ref 101–198)
Bilirubin: 1.1 mg/dL (ref 0.2–1.2)
Fibrosis Score: 0.22
GGT: 9 U/L (ref 3–40)
Haptoglobin: 124 mg/dL (ref 43–212)
Necroinflammat ACT Score: 0.11
Reference ID: 3599665

## 2020-02-19 LAB — HEPATITIS B CORE ANTIBODY, TOTAL: Hep B Core Total Ab: NONREACTIVE

## 2020-02-19 LAB — HEPATITIS C GENOTYPE

## 2020-02-19 LAB — HEPATITIS A ANTIBODY, TOTAL: Hepatitis A AB,Total: REACTIVE — AB

## 2020-02-19 LAB — HEPATITIS B SURFACE ANTIGEN: Hepatitis B Surface Ag: NONREACTIVE

## 2020-02-19 LAB — HIV ANTIBODY (ROUTINE TESTING W REFLEX): HIV 1&2 Ab, 4th Generation: NONREACTIVE

## 2021-01-08 ENCOUNTER — Other Ambulatory Visit: Payer: Self-pay

## 2021-01-08 ENCOUNTER — Ambulatory Visit: Payer: Medicaid Other | Admitting: Internal Medicine

## 2021-01-08 ENCOUNTER — Other Ambulatory Visit (HOSPITAL_COMMUNITY): Payer: Self-pay

## 2021-01-08 ENCOUNTER — Encounter: Payer: Self-pay | Admitting: Internal Medicine

## 2021-01-08 VITALS — BP 114/69 | HR 63 | Temp 97.9°F | Wt 145.0 lb

## 2021-01-08 DIAGNOSIS — Z23 Encounter for immunization: Secondary | ICD-10-CM | POA: Diagnosis not present

## 2021-01-08 DIAGNOSIS — B182 Chronic viral hepatitis C: Secondary | ICD-10-CM | POA: Diagnosis not present

## 2021-01-08 DIAGNOSIS — F1910 Other psychoactive substance abuse, uncomplicated: Secondary | ICD-10-CM | POA: Diagnosis not present

## 2021-01-08 LAB — POCT URINE PREGNANCY: Preg Test, Ur: NEGATIVE

## 2021-01-08 MED ORDER — MAVYRET 100-40 MG PO TABS
3.0000 | ORAL_TABLET | Freq: Every day | ORAL | 1 refills | Status: DC
  Filled 2021-01-08: qty 84, fill #0
  Filled 2021-01-15: qty 84, 28d supply, fill #0
  Filled 2021-02-06: qty 84, 28d supply, fill #1

## 2021-01-08 NOTE — Assessment & Plan Note (Signed)
She has Abs to hepatitis A and B so no vaccine indicated.

## 2021-01-08 NOTE — Assessment & Plan Note (Signed)
She remains drug free and I congratulated her on that.  

## 2021-01-08 NOTE — Progress Notes (Signed)
   Subjective:    Patient ID: Kristin Vaughn, female    DOB: 1990/12/19, 30 y.o.   MRN: [id number]  HPI Here for follow up for chronic hepatitis C I saw her one year ago as a new patient with chronic hepatitis C and was pregnant at the time so deferred treatment until after delivery (healthy baby girl).  She has genotype 1 a with a viral load of 3.7 million last year.  Never treated.  F0-1 on Fibrosure.  She is interested in treatment today.    Review of Systems  Constitutional:  Negative for fatigue.  Gastrointestinal:  Negative for diarrhea and nausea.  Skin:  Negative for rash.      Objective:   Physical Exam Cardiovascular:     Rate and Rhythm: Normal rate and regular rhythm.  Pulmonary:     Effort: Pulmonary effort is normal.  Musculoskeletal:        General: Normal range of motion.  Skin:    Findings: No rash.  Neurological:     Mental Status: She is alert.   SH: remains drug free       Assessment & Plan:

## 2021-01-08 NOTE — Assessment & Plan Note (Signed)
Now post delivery and ready for treatment.  Will update labs and prescribe Mavyret which is typically required by medicaid.  Prescription sent.  The patient also provided a form to fill out once the medication is approved to verify her medications.

## 2021-01-10 ENCOUNTER — Ambulatory Visit
Admission: RE | Admit: 2021-01-10 | Discharge: 2021-01-10 | Disposition: A | Discharge status: Home / Self Care | Payer: Medicaid Other | Source: Ambulatory Visit | Attending: Internal Medicine | Admitting: Internal Medicine

## 2021-01-10 DIAGNOSIS — B182 Chronic viral hepatitis C: Secondary | ICD-10-CM

## 2021-01-11 ENCOUNTER — Telehealth: Payer: Self-pay

## 2021-01-11 NOTE — Telephone Encounter (Signed)
[facility] Patient Advocate Encounter   Received notification from [employer] that prior authorization for Mavyret is required.   PA submitted on 01/11/21 Key [id number] Status is pending    [facility] Clinic will continue to follow.   Clearance Coots, CPhT Specialty Pharmacy Patient [facility] for Infectious Disease Phone: [phone] Fax:  [phone]

## 2021-01-12 ENCOUNTER — Telehealth: Payer: Self-pay

## 2021-01-12 NOTE — Telephone Encounter (Signed)
[facility] Patient Advocate Encounter  Prior Authorization for Mavyret has been approved.    PA# [id number] Effective dates: 01/12/21 through 03/09/21  Patients co-pay is $4.00.   Prescription will be getting filled at [facility], I will call patient on 09/19 to see if she want it to be mailed or if she would like it picked up.  [facility] Clinic will continue to follow.  Clearance Coots, CPhT Specialty Pharmacy Patient [facility] for Infectious Disease Phone: [phone] Fax:  [phone]

## 2021-01-15 ENCOUNTER — Other Ambulatory Visit (HOSPITAL_COMMUNITY): Payer: Self-pay

## 2021-01-16 ENCOUNTER — Telehealth: Payer: Self-pay

## 2021-01-16 ENCOUNTER — Telehealth: Payer: Self-pay | Admitting: Pharmacist

## 2021-01-16 NOTE — Telephone Encounter (Signed)
Patient is approved to receive Mavyret x 8 weeks for chronic Hepatitis C infection. Counseled patient to take all three tablets of Mavyret daily with food.  Counseled patient the need to take all three tablets together and to not separate them out during the day. Encouraged patient not to miss any doses and explained how their chance of cure could go down with each dose missed. Counseled patient on what to do if dose is missed - if it is closer to the missed dose take immediately; if closer to next dose then skip dose and take the next dose at the usual time. Counseled patient on common side effects such as headache, fatigue, and nausea and that these normally decrease with time. I reviewed patient medications and found no drug interactions. Discussed with patient that there are several drug interactions with Mavyret and instructed patient to call the clinic if she wishes to start a new medication during course of therapy. Also advised patient to call if she experiences any side effects. Patient will follow-up with Dr. Luciana Axe in the pharmacy clinic on 10/17.

## 2021-01-16 NOTE — Telephone Encounter (Signed)
[facility] Patient Advocate Encounter  Patient's medications have been couriered to [facility] from [facility] and will be picked up 01/16/21.  Clearance Coots , CPhT Specialty Pharmacy Patient [facility] Phone: [phone] Fax:  [phone]

## 2021-01-21 LAB — COMPLETE METABOLIC PANEL WITH GFR
AG Ratio: 1.6 (calc) (ref 1.0–2.5)
ALT: 37 U/L — ABNORMAL HIGH (ref 6–29)
AST: 26 U/L (ref 10–30)
Albumin: 4.5 g/dL (ref 3.6–5.1)
Alkaline phosphatase (APISO): 48 U/L (ref 31–125)
BUN: 15 mg/dL (ref 7–25)
CO2: 25 mmol/L (ref 20–32)
Calcium: 9.6 mg/dL (ref 8.6–10.2)
Chloride: 104 mmol/L (ref 98–110)
Creat: 0.8 mg/dL (ref 0.50–0.97)
Globulin: 2.9 g/dL (calc) (ref 1.9–3.7)
Glucose, Bld: 82 mg/dL (ref 65–99)
Potassium: 4 mmol/L (ref 3.5–5.3)
Sodium: 138 mmol/L (ref 135–146)
Total Bilirubin: 1.5 mg/dL — ABNORMAL HIGH (ref 0.2–1.2)
Total Protein: 7.4 g/dL (ref 6.1–8.1)
eGFR: 102 mL/min/{1.73_m2} (ref >=60)

## 2021-01-21 LAB — CBC WITH DIFFERENTIAL/PLATELET
Absolute Monocytes: 259 cells/uL (ref 200–950)
Basophils Absolute: 38 cells/uL (ref 0–200)
Basophils Relative: 0.8 %
Eosinophils Absolute: 298 cells/uL (ref 15–500)
Eosinophils Relative: 6.2 %
HCT: 38.8 % (ref 35.0–45.0)
Hemoglobin: 12.4 g/dL (ref 11.7–15.5)
Lymphs Abs: 1747 cells/uL (ref 850–3900)
MCH: 29.2 pg (ref 27.0–33.0)
MCHC: 32 g/dL (ref 32.0–36.0)
MCV: 91.3 fL (ref 80.0–100.0)
MPV: 11.1 fL (ref 7.5–12.5)
Monocytes Relative: 5.4 %
Neutro Abs: 2458 cells/uL (ref 1500–7800)
Neutrophils Relative %: 51.2 %
Platelets: 335 10*3/uL (ref 140–400)
RBC: 4.25 10*6/uL (ref 3.80–5.10)
RDW: 12.3 % (ref 11.0–15.0)
Total Lymphocyte: 36.4 %
WBC: 4.8 10*3/uL (ref 3.8–10.8)

## 2021-01-21 LAB — LIVER FIBROSIS, FIBROTEST-ACTITEST
ALT: 32 U/L — ABNORMAL HIGH (ref 6–29)
Alpha-2-Macroglobulin: 255 mg/dL (ref 106–279)
Apolipoprotein A1: 173 mg/dL (ref 101–198)
Bilirubin: 1.4 mg/dL — ABNORMAL HIGH (ref 0.2–1.2)
Fibrosis Score: 0.19
GGT: 10 U/L (ref 3–50)
Haptoglobin: 90 mg/dL (ref 43–212)
Necroinflammat ACT Score: 0.14
Reference ID: 4036662

## 2021-01-21 LAB — HIV ANTIBODY (ROUTINE TESTING W REFLEX): HIV 1&2 Ab, 4th Generation: NONREACTIVE

## 2021-01-21 LAB — HEPATITIS C RNA QUANTITATIVE
HCV Quantitative Log: 6.89 log IU/mL — ABNORMAL HIGH
HCV RNA, PCR, QN: 7810000 IU/mL — ABNORMAL HIGH

## 2021-01-21 LAB — HEPATITIS B SURFACE ANTIGEN: Hepatitis B Surface Ag: NONREACTIVE

## 2021-01-21 LAB — HEPATITIS C GENOTYPE

## 2021-02-05 ENCOUNTER — Emergency Department
Admission: EM | Admit: 2021-02-05 | Discharge: 2021-02-05 | Disposition: A | Discharge status: Home / Self Care | Payer: Medicaid Other | Source: Home / Self Care | Attending: Family Medicine | Admitting: Family Medicine

## 2021-02-05 ENCOUNTER — Other Ambulatory Visit: Payer: Self-pay

## 2021-02-05 ENCOUNTER — Emergency Department: Admit: 2021-02-05 | Discharge status: Absent without leave | Payer: Self-pay

## 2021-02-06 ENCOUNTER — Other Ambulatory Visit (HOSPITAL_COMMUNITY): Payer: Self-pay

## 2021-02-08 ENCOUNTER — Other Ambulatory Visit (HOSPITAL_COMMUNITY): Payer: Self-pay

## 2021-02-12 ENCOUNTER — Ambulatory Visit (INDEPENDENT_AMBULATORY_CARE_PROVIDER_SITE_OTHER): Payer: Medicaid Other | Admitting: Internal Medicine

## 2021-02-12 ENCOUNTER — Encounter: Payer: Self-pay | Admitting: Internal Medicine

## 2021-02-12 ENCOUNTER — Other Ambulatory Visit: Payer: Self-pay

## 2021-02-12 VITALS — BP 109/69 | HR 75 | Temp 98.7°F | Wt 145.2 lb

## 2021-02-12 DIAGNOSIS — Z5181 Encounter for therapeutic drug level monitoring: Secondary | ICD-10-CM

## 2021-02-12 DIAGNOSIS — F1910 Other psychoactive substance abuse, uncomplicated: Secondary | ICD-10-CM | POA: Diagnosis not present

## 2021-02-12 DIAGNOSIS — B182 Chronic viral hepatitis C: Secondary | ICD-10-CM

## 2021-02-13 DIAGNOSIS — Z5181 Encounter for therapeutic drug level monitoring: Secondary | ICD-10-CM | POA: Insufficient documentation

## 2021-02-13 LAB — COMPLETE METABOLIC PANEL WITH GFR
AG Ratio: 1.4 (calc) (ref 1.0–2.5)
ALT: 9 U/L (ref 6–29)
AST: 13 U/L (ref 10–30)
Albumin: 4.1 g/dL (ref 3.6–5.1)
Alkaline phosphatase (APISO): 59 U/L (ref 31–125)
BUN: 19 mg/dL (ref 7–25)
CO2: 29 mmol/L (ref 20–32)
Calcium: 9.8 mg/dL (ref 8.6–10.2)
Chloride: 104 mmol/L (ref 98–110)
Creat: 0.82 mg/dL (ref 0.50–0.97)
Globulin: 2.9 g/dL (calc) (ref 1.9–3.7)
Glucose, Bld: 87 mg/dL (ref 65–99)
Potassium: 4.5 mmol/L (ref 3.5–5.3)
Sodium: 138 mmol/L (ref 135–146)
Total Bilirubin: 0.7 mg/dL (ref 0.2–1.2)
Total Protein: 7 g/dL (ref 6.1–8.1)
eGFR: 99 mL/min/{1.73_m2} (ref >=60)

## 2021-02-13 LAB — HEPATITIS C RNA QUANTITATIVE
HCV Quantitative Log: <1.18 log IU/mL
HCV RNA, PCR, QN: <15 IU/mL

## 2021-02-13 NOTE — Assessment & Plan Note (Signed)
She is doing well on treatment and will check her viral load today.  She will then return in about 2 months for EOT RNA then 3 months after that.   HCC screening not indicated

## 2021-02-13 NOTE — Progress Notes (Signed)
   Subjective:    Patient ID: Kristin Vaughn, female    DOB: 1990/10/20, 30 y.o.   MRN: [id number]  HPI Here for follow up of chronic hepatitis C She started Mavyret for 8 weeks and now on her second month.  Some headache initially but not now and fatigue.  Taking daiily without missed doses.  No rash or GI issues.     Review of Systems  Constitutional:  Positive for fatigue.  Gastrointestinal:  Negative for nausea.  Neurological:  Negative for headaches.      Objective:   Physical Exam Eyes:     General: No scleral icterus. Pulmonary:     Effort: Pulmonary effort is normal.  Skin:    Findings: No rash.  Neurological:     General: No focal deficit present.     Mental Status: She is alert.   SH: + tobacco       Assessment & Plan:

## 2021-02-13 NOTE — Assessment & Plan Note (Signed)
Will check a CMP on Mavyret.

## 2021-02-13 NOTE — Assessment & Plan Note (Signed)
Doing well, remains drug free

## 2021-04-02 ENCOUNTER — Ambulatory Visit: Payer: Medicaid Other | Admitting: Internal Medicine

## 2021-04-04 ENCOUNTER — Encounter: Payer: Self-pay | Admitting: Internal Medicine

## 2021-04-04 ENCOUNTER — Other Ambulatory Visit: Payer: Self-pay

## 2021-04-04 ENCOUNTER — Ambulatory Visit (INDEPENDENT_AMBULATORY_CARE_PROVIDER_SITE_OTHER): Payer: Medicaid Other | Admitting: Internal Medicine

## 2021-04-04 VITALS — BP 115/75 | HR 77 | Temp 98.0°F | Wt 146.0 lb

## 2021-04-04 DIAGNOSIS — F1910 Other psychoactive substance abuse, uncomplicated: Secondary | ICD-10-CM

## 2021-04-04 DIAGNOSIS — B182 Chronic viral hepatitis C: Secondary | ICD-10-CM

## 2021-04-04 DIAGNOSIS — Z5181 Encounter for therapeutic drug level monitoring: Secondary | ICD-10-CM

## 2021-04-04 NOTE — Progress Notes (Signed)
   Subjective:    Patient ID: Kristin Vaughn, female    DOB: 1990-08-10, 30 y.o.   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  She had no issues during treatment and early viral load not detectable.  No complaints.    Review of Systems  Constitutional:  Negative for fatigue.  Gastrointestinal:  Negative for diarrhea and nausea.  Skin:  Negative for rash.      Objective:   Physical Exam Eyes:     General: No scleral icterus. Pulmonary:     Effort: Pulmonary effort is normal.  Skin:    Findings: No rash.  Neurological:     General: No focal deficit present.     Mental Status: She is alert.  Psychiatric:        Mood and Affect: Mood normal.   SH: no drug use       Assessment & Plan:

## 2021-04-04 NOTE — Assessment & Plan Note (Signed)
She remains drug free and encouraged continued abstinence.  She seems to be doing well.

## 2021-04-04 NOTE — Assessment & Plan Note (Signed)
Will check her LFTs after treatment.

## 2021-04-04 NOTE — Assessment & Plan Note (Addendum)
Will check her end of treatment viral load and then she can return in 3 months for SVR 12 and confirmation of cure.   HCC screening not indicated

## 2021-04-07 LAB — HEPATIC FUNCTION PANEL
AG Ratio: 1.6 (calc) (ref 1.0–2.5)
ALT: 10 U/L (ref 6–29)
AST: 13 U/L (ref 10–30)
Albumin: 4.2 g/dL (ref 3.6–5.1)
Alkaline phosphatase (APISO): 40 U/L (ref 31–125)
Bilirubin, Direct: 0.2 mg/dL (ref 0.0–0.2)
Globulin: 2.7 g/dL (calc) (ref 1.9–3.7)
Indirect Bilirubin: 0.7 mg/dL (calc) (ref 0.2–1.2)
Total Bilirubin: 0.9 mg/dL (ref 0.2–1.2)
Total Protein: 6.9 g/dL (ref 6.1–8.1)

## 2021-04-07 LAB — HEPATITIS C RNA QUANTITATIVE
HCV Quantitative Log: <1.18 log IU/mL
HCV RNA, PCR, QN: <15 IU/mL

## 2021-04-17 ENCOUNTER — Encounter (HOSPITAL_COMMUNITY): Payer: Self-pay | Admitting: Obstetrics & Gynecology

## 2021-05-16 ENCOUNTER — Other Ambulatory Visit: Payer: Self-pay

## 2021-05-16 ENCOUNTER — Ambulatory Visit: Payer: Medicaid Other | Attending: Obstetrics and Gynecology

## 2021-05-16 DIAGNOSIS — R293 Abnormal posture: Secondary | ICD-10-CM | POA: Insufficient documentation

## 2021-05-16 DIAGNOSIS — M6281 Muscle weakness (generalized): Secondary | ICD-10-CM | POA: Insufficient documentation

## 2021-05-16 DIAGNOSIS — R32 Unspecified urinary incontinence: Secondary | ICD-10-CM | POA: Diagnosis not present

## 2021-05-16 DIAGNOSIS — R279 Unspecified lack of coordination: Secondary | ICD-10-CM | POA: Diagnosis not present

## 2021-05-16 NOTE — Patient Instructions (Addendum)
Fiber:  ? Soluble fiber: o Slows down digestion o Increase sensation of fullness o Lowers cholesterol o Improves blood sugar  ? Insoluble fiber o Speeds up digestion o Eases and prevents constipation o Good for overall colon health  Examples of fiber rich foods: Soluble Fiber Insoluble Fiber  Strawberries Strawberries  Blueberries Blueberries  Apples Apples  Beans Peas  Nuts and Seeds Walnuts and Almonds  Oatmeal Whole grains  Avocados Avocados  Brussels sprouts Coconut  Sweet potatoes Radish  Broccoli Popcorn  Turnips Turnips  Apricots Passionfruit  Nectarines Cauliflower  Carrots Prune   Squatty potty: When your knees are level or below the level of your hips, pelvic floor muscles are pressed against rectum, preventing ease of bowel movement. By getting knees above the level of the hips, these pelvic floor muscles relax, allowing easier passage of bowel movement. ? Ways to get knees above hips: o Squatty Potty (7inch and 9inch versions) o Small stool o Roll of toilet paper under each foot o Hardback book or stack of magazines under each foot  Relaxed Toileting mechanics: Once in this position, make sure to lean forward with forearms on thighs, wide knees, relaxed stomach, and breathe.  Access Code: [email or web address] URL: [email or web address] Date: 05/16/2021 Prepared by: Heather Roberts  Exercises Supine Diaphragmatic Breathing - 5 x daily - 7 x weekly - 1 sets - 5-10 reps Supine Pelvic Floor Contraction - 2 x daily - 7 x weekly - 1 sets - 10 reps

## 2021-05-16 NOTE — Therapy (Addendum)
[facility] @ [place], [place], [address] Phone: [phone]   Fax:  [phone]  Physical Therapy Evaluation  Patient Details  Name: Kristin Vaughn MRN: [id number] Date of Birth: 1990/05/03 Referring Provider (PT): Donnel Saxon   Encounter Date: 05/16/2021   PT End of Session - 05/16/21 1327     Visit Number 1    Date for PT Re-Evaluation 07/11/21    Authorization Type [employer]    PT Start Time 1231    PT Stop Time 1316    PT Time Calculation (min) 45 min    Activity Tolerance Patient tolerated treatment well    Behavior During Therapy [facility] for tasks assessed/performed             Past Medical History:  Diagnosis Date   Hepatitis    Vaginal Pap smear, abnormal     Past Surgical History:  Procedure Laterality Date   BREAST SURGERY      There were no vitals filed for this visit.    Subjective Assessment - 05/16/21 1230     Subjective Pt states that since second vaginal delivery on 01/28/2020. She reports incontinence with laughing, coughing, sneezing, but her bigger issue is urgency with incontinence and waking at night 3x/night. She stops drinking water 4 hours before bed. She drinks 4 16oz bottles of water a day. She does drink several seltzers and two cups of coffee.    Pertinent History Being worked up for ulcerative colitis - colonoscopy scheduled    How long can you sit comfortably? no limitations    How long can you stand comfortably? no limitations    How long can you walk comfortably? no limitations    Patient Stated Goals Improve leaking, sleep through the night    Currently in Pain? No/denies    Multiple Pain Sites No                [facility] PT Assessment - 05/16/21 0001       Assessment   Medical Diagnosis Z39.0 - encoutner for care and examination of mother immediately after delivery, R32 - incontinence    Referring Provider (PT) Donnel Saxon    Onset Date/Surgical  Date 01/28/20    Next MD Visit none scheduled with referring provider    Prior Therapy no      Precautions   Precautions None      Restrictions   Weight Bearing Restrictions No      Balance Screen   Has the patient fallen in the past 6 months No    Has the patient had a decrease in activity level because of a fear of falling?  No    Is the patient reluctant to leave their home because of a fear of falling?  No      Home Ecologist residence    Living Arrangements Children      Prior Function   Level of Independence Independent      Cognition   Overall Cognitive Status Within Functional Limits for tasks assessed      Posture/Postural Control   Posture Comments Thoracic kyphosis, Lt shoulder/iliac crest elevation      ROM / Strength   AROM / PROM / Strength AROM;Strength      AROM   Overall AROM Comments Lumbar: Bil rotation limited by 50%, extension limited by 50%, flexion limited by 25%      Strength   Overall Strength Comments Bil  hip strength grossly 4/5      Flexibility   Soft Tissue Assessment /Muscle Length --   Mild reduction of 30% in Bil hamstring flexibility                   No emotional/communication barriers or cognitive limitation. Patient is motivated to learn. Patient understands and agrees with treatment goals and plan. PT explains patient will be examined in standing, sitting, and lying down to see how their muscles and joints work. When they are ready, they will be asked to remove their underwear so PT can examine their perineum. The patient is also given the option of providing their own chaperone as one is not provided in our facility. The patient also has the right and is explained the right to defer or refuse any part of the evaluation or treatment including the internal exam. With the patient's consent, PT will use one gloved finger to gently assess the muscles of the pelvic floor, seeing how well it contracts  and relaxes and if there is muscle symmetry. After, the patient will get dressed and PT and patient will discuss exam findings and plan of care. PT and patient discuss plan of care, schedule, attendance policy and HEP activities.     Objective measurements completed on examination: See above findings.     Pelvic Floor Special Questions - 05/16/21 0001     Prior Pelvic/Prostate Exam Yes    Are you Pregnant or attempting pregnancy? No    Prior Pregnancies Yes    Number of Pregnancies 5    Number of C-Sections 0    Number of Vaginal Deliveries 2   no tearing   Any difficulty with labor and deliveries No    Episiotomy Performed No    Currently Sexually Active No   hx of pain with deeper thrusting   History of sexually transmitted disease Yes   none recently, chlamydia   Urinary Leakage Yes    How often daily    Pad use 3    Activities that cause leaking Coughing;Sneezing;Laughing;Lifting    Urinary urgency Yes   with leaking   Urinary frequency every hour to every half hour - someimtes just small amounts coming out; 3x/night nocturia    Fecal incontinence Yes   urgency with BMs, 10+ times a day, examinging cause, states that she does not eat very healthy. BMs can be liquid to soft banana. Daily incontinence - but states that it is often just mucus or blood. She does feel like she has hemorrhoids. Does strain.   Fluid intake 70oz a day    Caffeine beverages yes    Skin Integrity Intact    Scar none    Perineal Body/Introitus  Normal    External Palpation WNL    Prolapse Anterior Wall   Grade 1   Pelvic Floor Internal Exam Pt identity confirmed and verbal consent for internal pelvic exam provided    Exam Type Vaginal    Sensation WNL    Palpation Urgency with palpation of Bil urethral muscles; mild tenderness with posterior levator ani; dryness noted throughout pelvic floor; bladder/urethral restriction in mobility that reproduced urgency    Strength Flicker   required multimodal cues  to prevent bearing down and perform appropriate contraction   Strength # of reps 3    Strength # of seconds 6    Tone Normal              [facility] Adult PT Treatment/Exercise - 05/16/21  0001       Self-Care   Self-Care Other Self-Care Comments   Fiber education to help increase bulk of stool, squatty potty and relaxed toileting mechanics to help improve complete void and relaxation with bowel movement     Neuro Re-ed    Neuro Re-ed Details  Pelvic floor A/ROM with multimodal cues                     PT Education - 05/16/21 1326     Education Details Pt education performed on pelvic floor anatomy, squatty potty and relaxed toileting mechanics, increasing fiber intake through diet, and initial HEP. Access Code: [email or web address]    Person(s) Educated Patient    Methods Explanation;Demonstration;Verbal cues;Tactile cues;Handout    Comprehension Verbalized understanding              PT Short Term Goals - 05/16/21 1352       PT SHORT TERM GOAL #1   Title Pt will be independent with initial HEP.    Time 2    Period Weeks    Status New    Target Date 05/30/21      PT SHORT TERM GOAL #2   Title Pt will improve pelvic floor contraction coordination and strength to 3/5 in order to improve bowel and bladder control.    Time 4    Period Weeks    Status New    Target Date 06/13/21      PT SHORT TERM GOAL #3   Title Pt will be able to teach back and utilize urge suppression technique and the knack in order to reduce urge and stress incontinence.    Time 4    Period Weeks    Status New    Target Date 06/13/21               PT Long Term Goals - 05/16/21 1354       PT LONG TERM GOAL #1   Title Pt will be independent with advanced HEP.    Time 8    Period Weeks    Status New    Target Date 07/11/21      PT LONG TERM GOAL #2   Title Pt report <1-2 episodes/day of urinary/fecal incontinence in order to improve confidence with community activities and hygiene.     Time 8    Period Weeks    Status New    Target Date 07/11/21      PT LONG TERM GOAL #3   Title Pt will increase all impaired Bil hip strength by one muscle grade in order to improve lumbopelvic stability and support pelvic floor.    Time 8    Period Weeks    Status New    Target Date 07/11/21      PT LONG TERM GOAL #4   Title Pt will report 1x/night nocturia or less in order to get more restful sleep.    Time 8    Period Weeks    Status New    Target Date 07/11/21      PT LONG TERM GOAL #5   Title Pt will be able to adhere to 2 hour or greater voiding schedule in order to perform job duties without frequent interruption.    Time 8    Period Weeks    Status New    Target Date 07/11/21                    Plan -  05/16/21 1331     Clinical Impression Statement Pt is a 31 year old female with chief complaint of urge incontinence and fecal incontinence since last vaginal delivery on 01/28/2020. Exam fidnings notbale for decreased lumbar A/ROM, Bil hip weakness, postural abnormalities, poor coordinatoin of pelvic floor contraction, pelvic floor strength 1/5, grade 1 anterior vaignal wall laxity, and decreased pelvic floor endurance. Signs and symptoms most consistent with poor control/coordination of pelvic floor muscles and poor bladder/bowel habits. She tolerated initial treatment of fiber/squatty potty education, diaphragmatic breathing, and pelvic floor A/ROM training well demonstrated by good improvements in coordination. She will benefit from skilled PT intervention in order to address impairments, improve bowel and bladder control, and increase QOL.    Personal Factors and Comorbidities Comorbidity 1    Comorbidities Two vaginal deliveries    Examination-Activity Limitations Continence    Examination-Participation Restrictions Interpersonal Relationship    Stability/Clinical Decision Making Stable/Uncomplicated    Clinical Decision Making Low    Rehab Potential Good     PT Frequency 2x / week    PT Duration 12 weeks    PT Treatment/Interventions ADLs/Self Care Home Management;Biofeedback;Cryotherapy;Electrical Stimulation;Moist Heat;Therapeutic activities;Therapeutic exercise;Neuromuscular re-education;Manual techniques;Patient/family education;Scar mobilization;Passive range of motion;Dry needling;Spinal Manipulations    PT Next Visit Plan Manual techniques to improve coordination of pelvic floor and work on bladder/urethral restriction; continue diaphragmatic breathing and re-evaluate pelvic floor contraction with breath coordination.    PT Home Exercise Plan Access Code: [email or web address]    Consulted and Agree with Plan of Care Patient             Patient will benefit from skilled therapeutic intervention in order to improve the following deficits and impairments:  Decreased coordination, Decreased range of motion, Increased fascial restricitons, Impaired tone, Decreased endurance, Increased muscle spasms, Decreased activity tolerance, Pain, Hypomobility, Impaired flexibility, Decreased mobility, Decreased strength, Postural dysfunction  Visit Diagnosis: Muscle weakness (generalized) - Plan: PT plan of care cert/re-cert  Unspecified lack of coordination - Plan: PT plan of care cert/re-cert  Abnormal posture - Plan: PT plan of care cert/re-cert     Problem List Patient Active Problem List   Diagnosis Date Noted   Medication monitoring encounter 02/13/2021   Substance abuse ([place]) 01/08/2021   SVD (spontaneous vaginal delivery) 10/1 01/28/2020   Chronic hepatitis C without hepatic coma ([place]) 01/28/2020   History of depression 01/28/2020   Postpartum care following vaginal delivery 10/1 01/28/2020   Heather Roberts, PT, DPT01/18/235:20 PM   [facility] [place] @ [place], [place], [address] Phone: [phone]   Fax:  [phone]  Name: Kristin Vaughn MRN: [id number] Date of Birth:  1990-10-31

## 2021-05-22 ENCOUNTER — Other Ambulatory Visit: Payer: Self-pay

## 2021-05-22 ENCOUNTER — Ambulatory Visit: Payer: Medicaid Other

## 2021-05-22 DIAGNOSIS — M6281 Muscle weakness (generalized): Secondary | ICD-10-CM

## 2021-05-22 DIAGNOSIS — R279 Unspecified lack of coordination: Secondary | ICD-10-CM

## 2021-05-22 DIAGNOSIS — R293 Abnormal posture: Secondary | ICD-10-CM

## 2021-05-22 NOTE — Therapy (Signed)
[facility] [address] [place], [place], [address] Phone: [phone]   Fax:  [phone]  Physical Therapy Treatment  Patient Details  Name: Kristin Vaughn MRN: [id number] Date of Birth: Aug 12, 1990 Referring Provider (PT): Nigel Bridgeman   Encounter Date: 05/22/2021   PT End of Session - 05/22/21 0851     Visit Number 2    Date for PT Re-Evaluation 07/11/21    Authorization Type [employer]    PT Start Time [id number]    PT Stop Time 0927    PT Time Calculation (min) 40 min    Activity Tolerance Patient tolerated treatment well    Behavior During Therapy [facility] for tasks assessed/performed             Past Medical History:  Diagnosis Date   Hepatitis    Vaginal Pap smear, abnormal     Past Surgical History:  Procedure Laterality Date   BREAST SURGERY      There were no vitals filed for this visit.   Subjective Assessment - 05/22/21 0848     Subjective Pt states that she has been doing exercises at least once a day. She is noticing some changes and has increased awareness of pelvic floor movement. No changes in leaking.    Patient Stated Goals Improve leaking, sleep through the night    Currently in Pain? No/denies    Multiple Pain Sites No                      No emotional/communication barriers or cognitive limitation. Patient is motivated to learn. Patient understands and agrees with treatment goals and plan. PT explains patient will be examined in standing, sitting, and lying down to see how their muscles and joints work. When they are ready, they will be asked to remove their underwear so PT can examine their perineum. The patient is also given the option of providing their own chaperone as one is not provided in our facility. The patient also has the right and is explained the right to defer or refuse any part of the evaluation or treatment including the internal exam. With the patient's  consent, PT will use one gloved finger to gently assess the muscles of the pelvic floor, seeing how well it contracts and relaxes and if there is muscle symmetry. After, the patient will get dressed and PT and patient will discuss exam findings and plan of care. PT and patient discuss plan of care, schedule, attendance policy and HEP activities.          [facility] Adult PT Treatment/Exercise - 05/22/21 0001       Self-Care   Self-Care Other Self-Care Comments   Urge suppression technique and strict bladder retraining     Neuro Re-ed    Neuro Re-ed Details  Pelvic floor A/ROM with multimodal cues      Exercises   Exercises Lumbar      Lumbar Exercises: Supine   Other Supine Lumbar Exercises Happy baby 2 x 60 sec; down training and relaxation; VC/TCs for relaxation and breathing      Lumbar Exercises: Quadruped   Madcat/Old Horse 20 reps   Breath coordination for down training and pelvic floor relaxation; VC/TCs   Other Quadruped Lumbar Exercises Child's pose, 2 x 60 sec; down training and pelvic floor relaxation; VC/TCs for breathing and relaxation      Manual Therapy   Manual Therapy Myofascial release;Internal Pelvic Floor    Myofascial  Release External bladder release and myofascial release to lower abdomen    Internal Pelvic Floor Urethral and bladder mobilizatoin Bil; bladder traction; Bil deep pelvic floor release with diaphragmaticbreathing                     PT Education - 05/22/21 0948     Education Details Pt education performed on strict bladder retraining, urge suppression techniques, and updated HEP. [email or web address]    Person(s) Educated Patient    Methods Explanation;Demonstration;Tactile cues;Verbal cues;Handout    Comprehension Verbalized understanding              PT Short Term Goals - 05/16/21 1352       PT SHORT TERM GOAL #1   Title Pt will be independent with initial HEP.    Time 2    Period Weeks    Status New    Target Date 05/30/21       PT SHORT TERM GOAL #2   Title Pt will improve pelvic floor contraction coordination and strength to 3/5 in order to improve bowel and bladder control.    Time 4    Period Weeks    Status New    Target Date 06/13/21      PT SHORT TERM GOAL #3   Title Pt will be able to teach back and utilize urge suppression technique and the knack in order to reduce urge and stress incontinence.    Time 4    Period Weeks    Status New    Target Date 06/13/21               PT Long Term Goals - 05/16/21 1354       PT LONG TERM GOAL #1   Title Pt will be independent with advanced HEP.    Time 8    Period Weeks    Status New    Target Date 07/11/21      PT LONG TERM GOAL #2   Title Pt report <1-2 episodes/day of urinary/fecal incontinence in order to improve confidence with community activities and hygiene.    Time 8    Period Weeks    Status New    Target Date 07/11/21      PT LONG TERM GOAL #3   Title Pt will increase all impaired Bil hip strength by one muscle grade in order to improve lumbopelvic stability and support pelvic floor.    Time 8    Period Weeks    Status New    Target Date 07/11/21      PT LONG TERM GOAL #4   Title Pt will report 1x/night nocturia or less in order to get more restful sleep.    Time 8    Period Weeks    Status New    Target Date 07/11/21      PT LONG TERM GOAL #5   Title Pt will be able to adhere to 2 hour or greater voiding schedule in order to perform job duties without frequent interruption.    Time 8    Period Weeks    Status New    Target Date 07/11/21                   Plan - 05/22/21 0951     Clinical Impression Statement Pt made good progress with pelvic floor A/ROM demosntrated by improved proprioception and relaxation contractions; therefore, urge suppression technique was taught and performed with moderate control. We discussed using this  technique to achieve 2 hour voiding window as part of bladder retraining program -  written handout provided on both. Good tolerance to all manual techniques with improved myofascial restriction and urethral/bladder mobility; pt did report urgency throughout, but decreasing pinching sensation. No increase in pain or urgency with down training exercises and HEP updated with written handout. She will continue to benefit from skilled PT intervention in order to work towards goals and improve QOL.    PT Treatment/Interventions ADLs/Self Care Home Management;Biofeedback;Cryotherapy;Electrical Stimulation;Moist Heat;Therapeutic activities;Therapeutic exercise;Neuromuscular re-education;Manual techniques;Patient/family education;Scar mobilization;Passive range of motion;Dry needling;Spinal Manipulations    PT Next Visit Plan Hip flexor stretches to help reduce tension in other relaxation poses; focus manual techniques to low back, posterior pelvic, coccyx; consider starting gentle core facilitation.    PT Home Exercise Plan Access Code: [email or web address]    Consulted and Agree with Plan of Care Patient             Patient will benefit from skilled therapeutic intervention in order to improve the following deficits and impairments:  Decreased coordination, Decreased range of motion, Increased fascial restricitons, Impaired tone, Decreased endurance, Increased muscle spasms, Decreased activity tolerance, Pain, Hypomobility, Impaired flexibility, Decreased mobility, Decreased strength, Postural dysfunction  Visit Diagnosis: Muscle weakness (generalized)  Unspecified lack of coordination  Abnormal posture     Problem List Patient Active Problem List   Diagnosis Date Noted   Medication monitoring encounter 02/13/2021   Substance abuse (HCC) 01/08/2021   SVD (spontaneous vaginal delivery) 10/1 01/28/2020   Chronic hepatitis C without hepatic coma (HCC) 01/28/2020   History of depression 01/28/2020   Postpartum care following vaginal delivery 10/1 01/28/2020    Julio AlmKristen Iverson Sees, PT,  DPT01/24/239:56 AM  [facility] [address] [place], [place], [address] Phone: [phone]   Fax:  [phone]  Name: Kristin BoatmanKatherine Vaughn MRN: [id number] Date of Birth: [birth year]

## 2021-05-22 NOTE — Patient Instructions (Addendum)
Bladder/bowel retraining:  Drink 4-8oz an hour ONLY WATER Stop water intake 3 hours before bed Try to go at least 2-3 hours between trips to the bathroom - go whether you need to or not.  For two weeks.  Urge suppression technique: A technique to help you hold urine until its an appropriate time to go, whether this is making it home or trying to reach a specific voiding time frame according to your schedule. It helps to send signals from the bladder to the brain that say you dont actually have to void urine right now. This most likely only give you several minutes of relief at first, but repeat as needed; the benefit will last longer as you use this technique more and get into better bladder habits. ?  The technique: o Perform 5 quick flicks (Kegels) rapidly, not worrying about fully relaxing in between each (only in this technique). o Then perform several deep belly breaths while focusing on relaxing the pelvic floor. o Go do something else to help distract yourself from the urge to urinate. o Repeat as needed.  Access Code: [email or web address] URL: [email or web address] Date: 05/22/2021 Prepared by: Julio Alm  Exercises Supine Diaphragmatic Breathing - 5 x daily - 7 x weekly - 1 sets - 5-10 reps Supine Pelvic Floor Contraction - 2 x daily - 7 x weekly - 1 sets - 10 reps Cat Cow - 1 x daily - 7 x weekly - 2 sets - 10 reps Child's Pose Stretch - 1 x daily - 7 x weekly - 1 sets - 2 reps - 60 hold Happy Baby with Pelvic Floor Lengthening - 1 x daily - 7 x weekly - 1 sets - 2 reps - 60 hold

## 2021-05-28 ENCOUNTER — Institutional Professional Consult (permissible substitution): Payer: Medicaid Other | Admitting: Plastic Surgery

## 2021-05-29 ENCOUNTER — Other Ambulatory Visit: Payer: Self-pay

## 2021-05-29 ENCOUNTER — Ambulatory Visit: Payer: Medicaid Other

## 2021-05-29 DIAGNOSIS — R293 Abnormal posture: Secondary | ICD-10-CM

## 2021-05-29 DIAGNOSIS — R279 Unspecified lack of coordination: Secondary | ICD-10-CM

## 2021-05-29 DIAGNOSIS — M6281 Muscle weakness (generalized): Secondary | ICD-10-CM

## 2021-05-29 NOTE — Patient Instructions (Addendum)
Access Code: [email or web address] URL: [email or web address] Date: 05/29/2021 Prepared by: Heather Roberts  Exercises Supine Transversus Abdominis Bracing - Hands on Stomach - 1 x daily - 7 x weekly - 2 sets - 10 reps Supine Transversus Abdominis Bracing with Leg Extension - 1 x daily - 7 x weekly - 2 sets - 10 reps Supine Shoulder Horizontal Abduction with Resistance - 1 x daily - 7 x weekly - 2 sets - 10 reps Half Kneeling Hip Flexor Stretch - 1 x daily - 7 x weekly - 3 sets - 10 reps Seated Piriformis Stretch - 1 x daily - 7 x weekly - 3 sets - 10 reps

## 2021-05-29 NOTE — Therapy (Addendum)
[facility] [address] [place], [place], [address] Phone: [phone]   Fax:  [phone]  Physical Therapy Treatment  Patient Details  Name: Kristin Vaughn MRN: [id number] Date of Birth: 02/24/1991 Referring Provider (PT): Nigel Bridgeman   Encounter Date: 05/29/2021   PT End of Session - 05/29/21 1304     Visit Number 3    Date for PT Re-Evaluation 07/11/21    Authorization Type [employer]    PT Start Time 1231    PT Stop Time 1312    PT Time Calculation (min) 41 min    Activity Tolerance Patient tolerated treatment well    Behavior During Therapy [facility] for tasks assessed/performed             Past Medical History:  Diagnosis Date   Hepatitis    Vaginal Pap smear, abnormal     Past Surgical History:  Procedure Laterality Date   BREAST SURGERY      There were no vitals filed for this visit.   Subjective Assessment - 05/29/21 1233     Subjective Pt states that she did not work on liquid intake due to colonoscopy and being on a very strange diet for the last couple of days. She does report improvements in urgency and fecal incontinence with use of urge suppression technique.    Patient Stated Goals Improve leaking, sleep through the night    Currently in Pain? No/denies    Multiple Pain Sites No                               [facility] Adult PT Treatment/Exercise - 05/29/21 0001       Self-Care   Self-Care Other Self-Care Comments   Fiber supplemtn/psyllium husk and increased water intake     Neuro Re-ed    Neuro Re-ed Details  Transversus abdominus training with VC/TCs for appropriate core facilitation; supine march 2 x 10, transversus leg extensions 2 x 10 Bil      Exercises   Exercises Lumbar      Lumbar Exercises: Stretches   Hip Flexor Stretch Right;Left;1 rep;60 seconds    Standing Side Bend Right;Left;60 seconds;1 rep      Manual Therapy   Manual Therapy Myofascial  release;Joint mobilization;Other (comment)    Joint Mobilization L1-S1 Grade 2-3 posterior-anterior    Myofascial Release instrument assisted to lumbar parapsinals    Other Manual Therapy mobilization with movement during cat/cow to coccyx/posterior pelvic floor                     PT Education - 05/29/21 1259     Education Details Education on fiber intake if she is unable to make dietary changes at this time; instructed to make sure she is increasingwater intake when she takes psyllium husk; HEP updated with new handout. Access Code: [email or web address]    Person(s) Educated Patient    Methods Explanation;Demonstration;Tactile cues;Verbal cues;Handout    Comprehension Verbalized understanding              PT Short Term Goals - 05/16/21 1352       PT SHORT TERM GOAL #1   Title Pt will be independent with initial HEP.    Time 2    Period Weeks    Status New    Target Date 05/30/21      PT SHORT TERM GOAL #2   Title Pt will improve  pelvic floor contraction coordination and strength to 3/5 in order to improve bowel and bladder control.    Time 4    Period Weeks    Status New    Target Date 06/13/21      PT SHORT TERM GOAL #3   Title Pt will be able to teach back and utilize urge suppression technique and the knack in order to reduce urge and stress incontinence.    Time 4    Period Weeks    Status New    Target Date 06/13/21               PT Long Term Goals - 05/16/21 1354       PT LONG TERM GOAL #1   Title Pt will be independent with advanced HEP.    Time 8    Period Weeks    Status New    Target Date 07/11/21      PT LONG TERM GOAL #2   Title Pt report <1-2 episodes/day of urinary/fecal incontinence in order to improve confidence with community activities and hygiene.    Time 8    Period Weeks    Status New    Target Date 07/11/21      PT LONG TERM GOAL #3   Title Pt will increase all impaired Bil hip strength by one muscle grade in order to  improve lumbopelvic stability and support pelvic floor.    Time 8    Period Weeks    Status New    Target Date 07/11/21      PT LONG TERM GOAL #4   Title Pt will report 1x/night nocturia or less in order to get more restful sleep.    Time 8    Period Weeks    Status New    Target Date 07/11/21      PT LONG TERM GOAL #5   Title Pt will be able to adhere to 2 hour or greater voiding schedule in order to perform job duties without frequent interruption.    Time 8    Period Weeks    Status New    Target Date 07/11/21                   Plan - 05/29/21 1253     Clinical Impression Statement Pt morediscouragedtoday because colonoscopy did not help to explain bowel dysfunction; however, she is seeing improvement in urgency and fecal incontinence with use of urge suppression technique. Due to restriction in low back, posterior hips, and coccyx, manual techniques focused here; she demonstrated good reduction in soft tissue restriction and reported decreased tightness. Excellent transversus activation with VC/TCs and she was able to incorporate into exericse progressions; believe this will help to improve how core is functioning and to help reduce pelvic floor tension. She will continue to benefit from skilled PT intervention in order to work towards goal completion and improve QOL.    PT Treatment/Interventions ADLs/Self Care Home Management;Biofeedback;Cryotherapy;Electrical Stimulation;Moist Heat;Therapeutic activities;Therapeutic exercise;Neuromuscular re-education;Manual techniques;Patient/family education;Scar mobilization;Passive range of motion;Dry needling;Spinal Manipulations    PT Next Visit Plan Will be last visit before 3 month break due to work training; plan to perofrm internal manual techniques if patient comfortable while on menstrual cycle; progress core strengthening and begin hip strengthening.    PT Home Exercise Plan Access Code: [email or web address]    Consulted and Agree with  Plan of Care Patient             Patient will benefit from  skilled therapeutic intervention in order to improve the following deficits and impairments:  Decreased coordination, Decreased range of motion, Increased fascial restricitons, Impaired tone, Decreased endurance, Increased muscle spasms, Decreased activity tolerance, Pain, Hypomobility, Impaired flexibility, Decreased mobility, Decreased strength, Postural dysfunction  Visit Diagnosis: Muscle weakness (generalized)  Unspecified lack of coordination  Abnormal posture     Problem List Patient Active Problem List   Diagnosis Date Noted   Medication monitoring encounter 02/13/2021   Substance abuse (HCC) 01/08/2021   SVD (spontaneous vaginal delivery) 10/1 01/28/2020   Chronic hepatitis C without hepatic coma (HCC) 01/28/2020   History of depression 01/28/2020   Postpartum care following vaginal delivery 10/1 01/28/2020    Julio AlmKristen Nabor Thomann, PT, DPT01/31/231:27 PM   [facility] [address] [place], [place], [address] Phone: [phone]   Fax:  [phone]  Name: Nida BoatmanKatherine Calvey MRN: [id number] Date of Birth: Oct 31, 1990

## 2021-05-31 ENCOUNTER — Other Ambulatory Visit: Payer: Self-pay

## 2021-05-31 ENCOUNTER — Ambulatory Visit: Payer: Medicaid Other | Attending: Obstetrics and Gynecology

## 2021-05-31 DIAGNOSIS — R293 Abnormal posture: Secondary | ICD-10-CM | POA: Diagnosis present

## 2021-05-31 DIAGNOSIS — M6281 Muscle weakness (generalized): Secondary | ICD-10-CM | POA: Insufficient documentation

## 2021-05-31 DIAGNOSIS — R279 Unspecified lack of coordination: Secondary | ICD-10-CM | POA: Diagnosis present

## 2021-05-31 NOTE — Patient Instructions (Signed)
Access Code: [email or web address] URL: [email or web address] Date: 05/31/2021 Prepared by: Julio Alm  Exercises Supine Transversus Abdominis Bracing - Hands on Stomach - 1 x daily - 7 x weekly - 2 sets - 10 reps Supine Transversus Abdominis Bracing with Leg Extension - 1 x daily - 7 x weekly - 2 sets - 10 reps Supine Shoulder Horizontal Abduction with Resistance - 1 x daily - 7 x weekly - 2 sets - 10 reps Half Kneeling Hip Flexor Stretch - 1 x daily - 7 x weekly - 3 sets - 10 reps Seated Piriformis Stretch - 1 x daily - 7 x weekly - 3 sets - 10 reps Standing Bilateral Low Shoulder Row with Anchored Resistance - 1 x daily - 7 x weekly - 2 sets - 10 reps Shoulder extension with resistance - Neutral - 1 x daily - 7 x weekly - 2 sets - 10 reps Standing Anti-Rotation Press with Anchored Resistance - 1 x daily - 7 x weekly - 2 sets - 10 reps Standing 3-Way Kick - 1 x daily - 7 x weekly - 2 sets - 10 reps Side Stepping with Resistance at Feet - 1 x daily - 7 x weekly - 3 sets - 10 reps Squat - 1 x daily - 7 x weekly - 2 sets - 10 reps

## 2021-05-31 NOTE — Therapy (Signed)
[facility] @ [place], [place], [address] Phone: [phone]   Fax:  [phone]  Physical Therapy Treatment  Patient Details  Name: Kristin Vaughn MRN: [id number] Date of Birth: Oct 03, 1990 Referring Provider (PT): Donnel Saxon   Encounter Date: 05/31/2021   PT End of Session - 05/31/21 1307     Visit Number 4    Authorization Type [employer]    PT Start Time 1230    PT Stop Time 1313    PT Time Calculation (min) 43 min    Activity Tolerance Patient tolerated treatment well    Behavior During Therapy [facility] for tasks assessed/performed             Past Medical History:  Diagnosis Date   Hepatitis    Vaginal Pap smear, abnormal     Past Surgical History:  Procedure Laterality Date   BREAST SURGERY      There were no vitals filed for this visit.   Subjective Assessment - 05/31/21 1229     Subjective Pt states that she felt very good after last treatment session. She feels like Rt side of abdomen is engaging more as she practices the exercises. She was having flare up of stomach issues yesterday and did have exacerbation of fecal incontinence. She has been doing much better with urinary incontinence and is waking up only 1x during the night. She still is going more often than every 2 hours.    Patient Stated Goals Improve leaking, sleep through the night    Currently in Pain? No/denies    Multiple Pain Sites No                [facility] PT Assessment - 05/31/21 0001       AROM   Overall AROM Comments Lumbar: flexion still limited by 25%, all other A/ROM WNL      Strength   Overall Strength Comments Hip strength still grossly 4/5                           [facility] Adult PT Treatment/Exercise - 05/31/21 0001       Exercises   Exercises Knee/Hip      Lumbar Exercises: Standing   Row Strengthening;20 reps;Theraband   green   Shoulder Extension Strengthening;20  reps;Theraband   green   Other Standing Lumbar Exercises Pallof press 10x Bil      Lumbar Exercises: Seated   Other Seated Lumbar Exercises Horizontal abduction/extension with transversus abdominus contraction and breath coordination 2 x 10    Other Seated Lumbar Exercises Small swiss ball lift with core facilitation 2 x 10      Knee/Hip Exercises: Standing   Functional Squat 20 reps    Other Standing Knee Exercises Sidestepping 3 laps, 10 steps each    Other Standing Knee Exercises 3-way kick bil, 10x each direction                     PT Education - 05/31/21 1306     Education Details Pt education performed on review of bladder retraining, HEP updates, and incstruction to call with any questions/concerns. [id number]    Person(s) Educated Patient    Methods Explanation;Demonstration;Tactile cues;Verbal cues;Handout    Comprehension Verbalized understanding              PT Short Term Goals - 05/31/21 1259       PT SHORT TERM GOAL #1  Title Pt will be independent with initial HEP.    Time 2    Period Weeks    Status Achieved    Target Date 05/30/21      PT SHORT TERM GOAL #2   Title Pt will improve pelvic floor contraction coordination and strength to 3/5 in order to improve bowel and bladder control.    Time 4    Period Weeks    Status Partially Met    Target Date 06/13/21      PT SHORT TERM GOAL #3   Title Pt will be able to teach back and utilize urge suppression technique and the knack in order to reduce urge and stress incontinence.    Time 4    Period Weeks    Status Achieved    Target Date 06/13/21               PT Long Term Goals - 05/31/21 1300       PT LONG TERM GOAL #1   Title Pt will be independent with advanced HEP.    Time 8    Period Weeks    Status Partially Met    Target Date 07/11/21      PT LONG TERM GOAL #2   Title Pt report <1-2 episodes/day of urinary/fecal incontinence in order to improve confidence with community  activities and hygiene.    Time 8    Period Weeks    Status Achieved    Target Date 07/11/21      PT LONG TERM GOAL #3   Title Pt will increase all impaired Bil hip strength by one muscle grade in order to improve lumbopelvic stability and support pelvic floor.    Time 8    Period Weeks    Status Not Met    Target Date 07/11/21      PT LONG TERM GOAL #4   Title Pt will report 1x/night nocturia or less in order to get more restful sleep.    Time 8    Period Weeks    Status Achieved    Target Date 07/11/21      PT LONG TERM GOAL #5   Title Pt will be able to adhere to 2 hour or greater voiding schedule in order to perform job duties without frequent interruption.    Time 8    Period Weeks    Status Partially Met    Target Date 07/11/21                   Plan - 05/31/21 1251     Clinical Impression Statement Pt has made excellent progress in several treatment sessions demosntrated by decrease in nocturia to 1x/night, rare urinary incontinence, and improved core strength/coordination. Due to discharge today, all strengthening exercises progressed and updated on HEP. She had diffiuclty with posture in standing core activities (rows/extensions) with tendency to decrease gluteal/core activation and over-extend hips/knees; she was able to facilitate appropriate core contraction and correct posture/form throughout exercise. Believe she will continue to make progress working on strengthening and mobility activities, bladder retraining program, and improved toileting techniques until she can return for more visits after work training period is over. Due to scheduling, pt is prepared to D/C skilled PT intervention at this time; she was encouraged to call with any questions or concerns.    Stability/Clinical Decision Making Stable/Uncomplicated    Clinical Decision Making Low    PT Treatment/Interventions ADLs/Self Care Home Management;Biofeedback;Cryotherapy;Electrical  Stimulation;Moist Heat;Therapeutic activities;Therapeutic exercise;Neuromuscular re-education;Manual  techniques;Patient/family education;Scar mobilization;Passive range of motion;Dry needling;Spinal Manipulations    PT Next Visit Plan D/C; pt will return in 3 months for evaluation once her schedule allows.    PT Home Exercise Plan Access Code: [email or web address]    Consulted and Agree with Plan of Care Patient             Patient will benefit from skilled therapeutic intervention in order to improve the following deficits and impairments:  Decreased coordination, Decreased range of motion, Increased fascial restricitons, Impaired tone, Decreased endurance, Increased muscle spasms, Decreased activity tolerance, Pain, Hypomobility, Impaired flexibility, Decreased mobility, Decreased strength, Postural dysfunction  Visit Diagnosis: Muscle weakness (generalized)  Unspecified lack of coordination  Abnormal posture     Problem List Patient Active Problem List   Diagnosis Date Noted   Medication monitoring encounter 02/13/2021   Substance abuse ([place]) 01/08/2021   SVD (spontaneous vaginal delivery) 10/1 01/28/2020   Chronic hepatitis C without hepatic coma ([place]) 01/28/2020   History of depression 01/28/2020   Postpartum care following vaginal delivery 10/1 01/28/2020   Heather Roberts, PT, DPT02/02/231:59 PM   [facility] @ [place], [place], [address] Phone: [phone]   Fax:  [phone]  Name: Kristin Vaughn MRN: [id number] Date of Birth: 14-Sep-1990  PHYSICAL THERAPY DISCHARGE SUMMARY  Visits from Start of Care: 05/16/21  Current functional level related to goals / functional outcomes: Pt progressed well towards goal completion in 4 sessions, but she has to D/C before completion. She has the tools to continue working on addressing goals until she can return to physical therapy when schedule allows.    Remaining  deficits: Pt continues to have urinary frequency and fecal urgency/incontinence.    Education / Equipment: Pt education performed of updated HEP; we reviewed all lifestyle modifications to help with condition. she was encouraged to call with any questions or concerns.    Patient agrees to discharge. Patient goals were partially met. Patient is being discharged due to the patient's request.  Thank you for your referral,  Heather Roberts, PT, DPT02/02/232:21 PM

## 2021-06-01 ENCOUNTER — Ambulatory Visit: Payer: Medicaid Other | Admitting: Plastic Surgery

## 2021-06-01 ENCOUNTER — Other Ambulatory Visit: Payer: Self-pay

## 2021-06-01 VITALS — BP 122/78 | HR 76 | Ht 66.0 in | Wt 146.0 lb

## 2021-06-01 DIAGNOSIS — T8543XA Leakage of breast prosthesis and implant, initial encounter: Secondary | ICD-10-CM | POA: Diagnosis not present

## 2021-06-05 NOTE — Progress Notes (Signed)
° °  Referring Provider No referring provider defined for this encounter.   CC: Concern for ruptured implant left breast   Kristin Vaughn is an 31 y.o. female.  HPI: Patient is a 32 year old who has a history of breast augmentation with silicone implants.  She believes they are textured.  Approximately 7 years ago she had an augmentation.  She is concerned because her left breast has an area inferior to the nipple areolar complex that appears different or deflated and she is worried about rupture.  She has not had breast MRI since her surgery.  She has had a recent pregnancy and breast-fed.  No Known Allergies  Outpatient Encounter Medications as of 06/01/2021  Medication Sig   sertraline (ZOLOFT) 50 MG tablet Take 1 tablet (50 mg total) by mouth daily.   No facility-administered encounter medications on file as of 06/01/2021.     Past Medical History:  Diagnosis Date   Hepatitis    Vaginal Pap smear, abnormal     Past Surgical History:  Procedure Laterality Date   BREAST SURGERY      No family history on file.  Social History   Social History Narrative   Not on file     Review of Systems General: Denies fevers, chills, weight loss CV: Denies chest pain, shortness of breath, palpitations   Physical Exam Vitals with BMI 06/01/2021 04/04/2021 02/12/2021  Height 5\' 6"  - -  Weight 146 lbs 146 lbs 145 lbs 3 oz  BMI 23.58 - -  Systolic 122 115  Diastolic 78 75 69  Pulse 76 77 75    General:  No acute distress,  Alert and oriented, Non-Toxic, Normal speech and affect Breast: Relatively symmetric augmentation.  Capsules soft bilaterally.  Very subtle area of volume deficiency in the left lower pole below the nipple areolar complex  Assessment/Plan Patient is concerned about implant rupture.  I think that since she has not had MRIs to evaluate her breast augmentation this is indicated to rule out rupture.  Overall her result is satisfactory and I do not think she  requires surgery at this time if she is not ruptured.  951 06/05/2021, 10:16 AM

## 2021-07-04 ENCOUNTER — Ambulatory Visit: Payer: Medicaid Other | Admitting: Internal Medicine

## 2021-09-21 ENCOUNTER — Telehealth: Payer: Self-pay

## 2021-09-21 NOTE — Telephone Encounter (Signed)
Faxed MRI referral to [facility] with confirmed receipt.

## 2021-10-12 ENCOUNTER — Telehealth: Payer: Self-pay | Admitting: *Deleted

## 2021-10-12 NOTE — Telephone Encounter (Signed)
Called pt and LMOM to call [facility] and get scheduled, left number to [facility] as well.

## 2021-10-19 ENCOUNTER — Other Ambulatory Visit: Payer: Self-pay | Admitting: Plastic Surgery

## 2021-10-19 DIAGNOSIS — Z77018 Contact with and (suspected) exposure to other hazardous metals: Secondary | ICD-10-CM

## 2021-10-22 ENCOUNTER — Other Ambulatory Visit: Payer: Self-pay

## 2021-10-22 ENCOUNTER — Telehealth: Payer: Self-pay

## 2021-10-22 DIAGNOSIS — T8543XA Leakage of breast prosthesis and implant, initial encounter: Secondary | ICD-10-CM

## 2021-10-22 DIAGNOSIS — M795 Residual foreign body in soft tissue: Secondary | ICD-10-CM

## 2021-10-25 ENCOUNTER — Ambulatory Visit: Payer: Medicaid Other | Admitting: Family

## 2021-11-05 ENCOUNTER — Ambulatory Visit
Admission: RE | Admit: 2021-11-05 | Discharge: 2021-11-05 | Disposition: A | Discharge status: Home / Self Care | Payer: Medicaid Other | Source: Ambulatory Visit | Attending: Plastic Surgery | Admitting: Plastic Surgery

## 2021-11-05 ENCOUNTER — Other Ambulatory Visit: Payer: Medicaid Other

## 2021-11-05 DIAGNOSIS — Z77018 Contact with and (suspected) exposure to other hazardous metals: Secondary | ICD-10-CM

## 2022-05-07 ENCOUNTER — Other Ambulatory Visit (HOSPITAL_COMMUNITY): Payer: Self-pay

## 2022-05-27 ENCOUNTER — Other Ambulatory Visit (HOSPITAL_COMMUNITY): Payer: Self-pay

## 2023-07-24 IMAGING — US US ABDOMEN COMPLETE
1 series · 14 of 25 positions shown · non-contrast
Comparison: None.

CLINICAL DATA: Chronic hepatitis C.

EXAM:
ABDOMEN ULTRASOUND COMPLETE

[Series 1: us abdomen complete · 0.19mm/px · 14 of 91 slices shown]
[im 1/91]
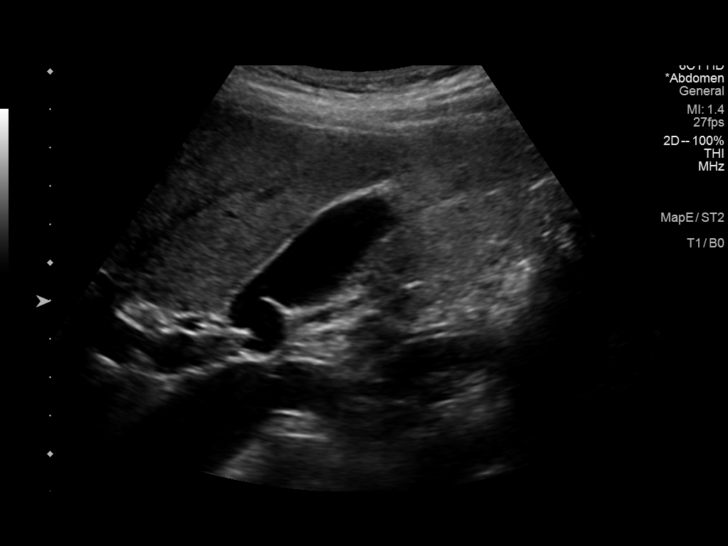
[im 8/91]
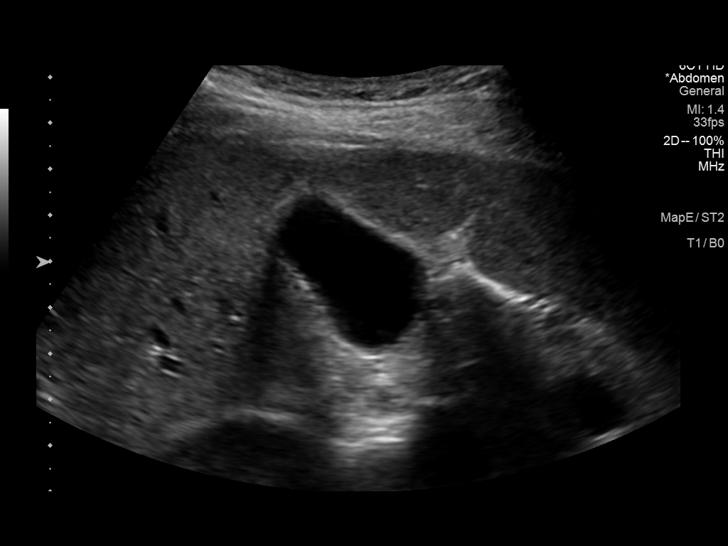
[im 16/91]
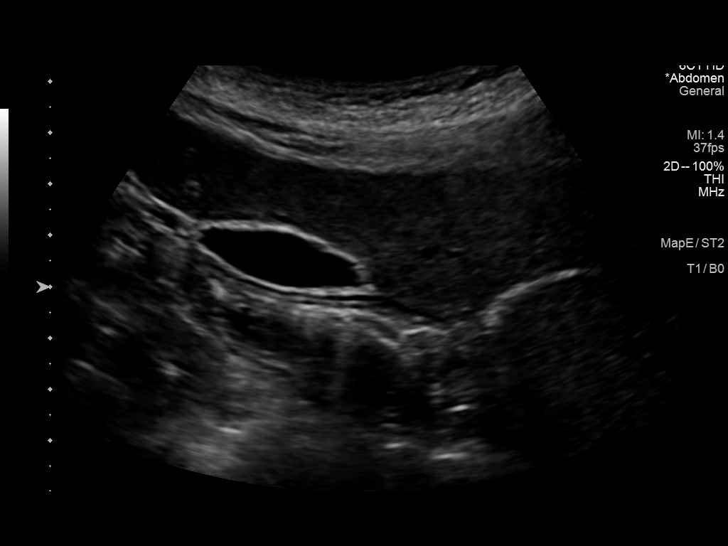
[im 23/91]
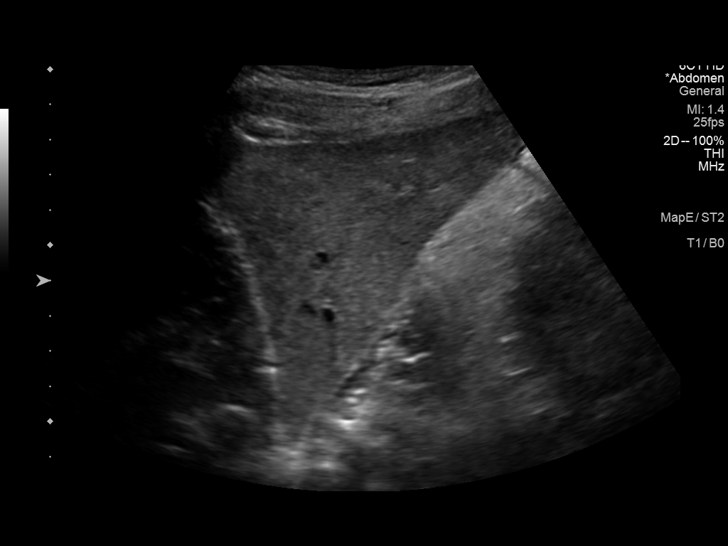
[im 31/91]
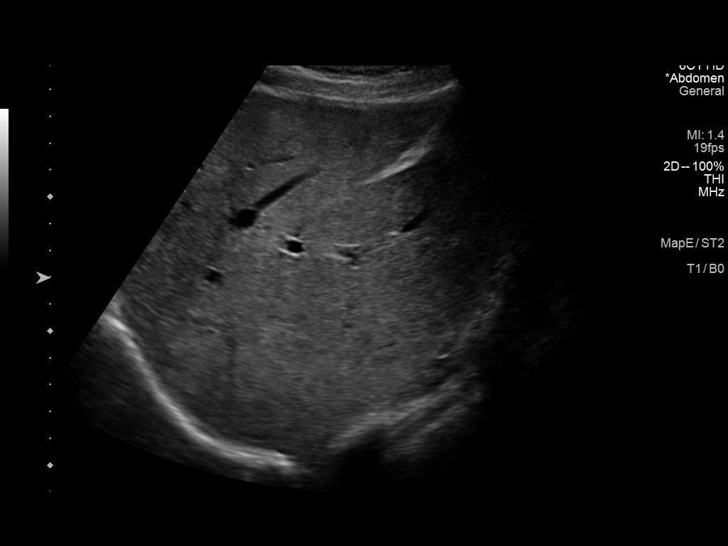
[im 34/91]
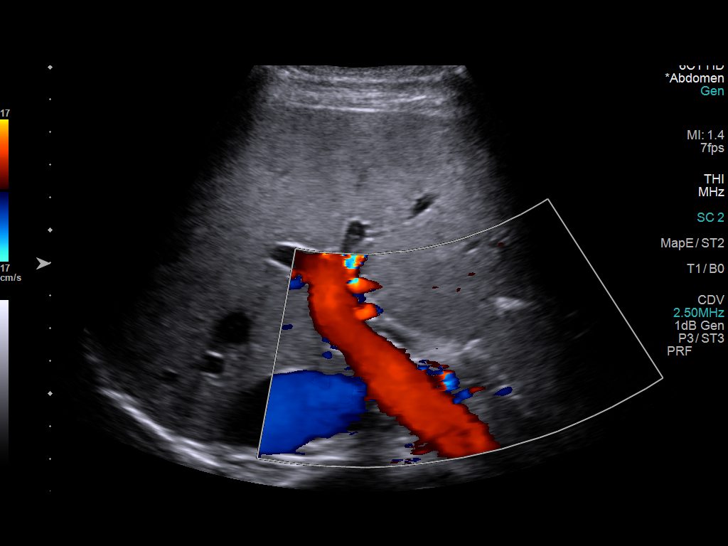
[im 42/91]
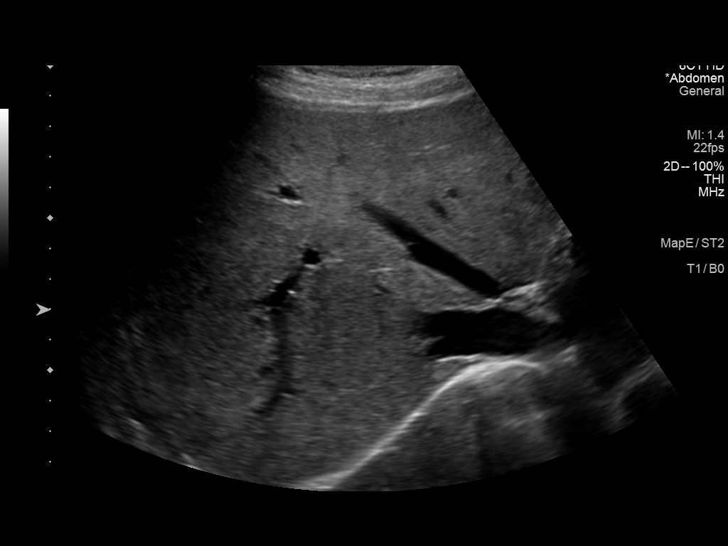
[im 49/91]
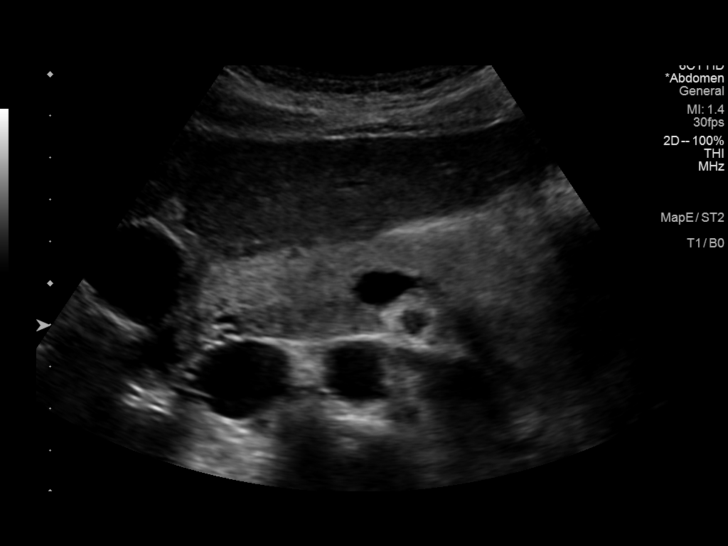
[im 57/91]
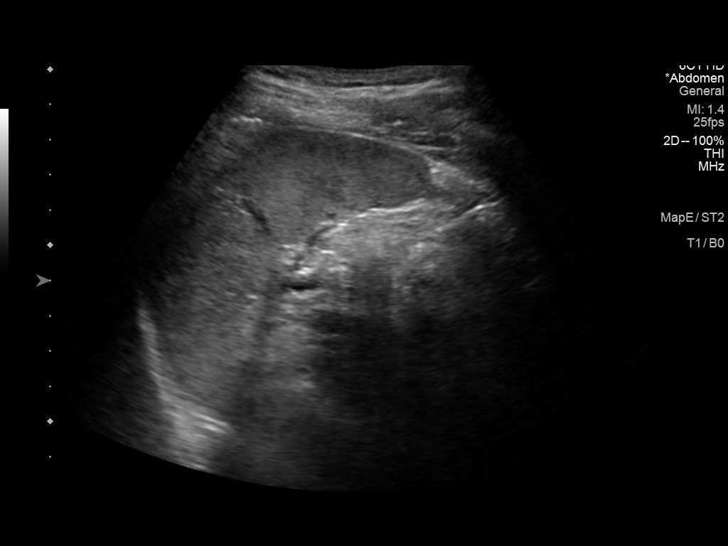
[im 61/91]
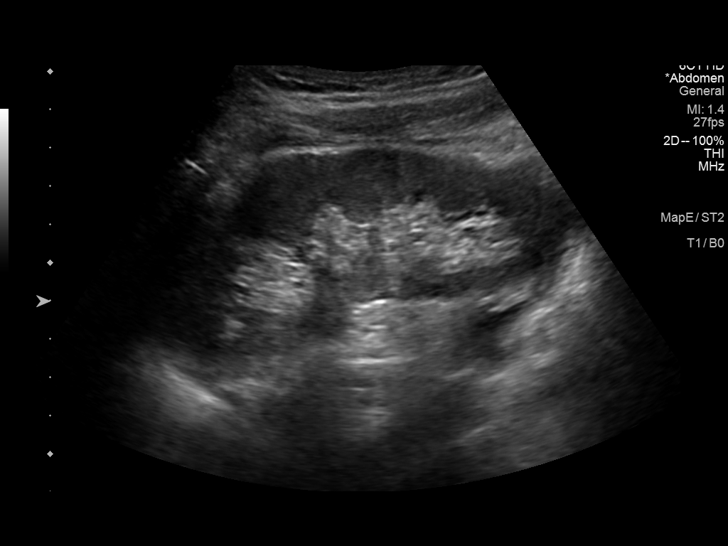
[im 68/91]
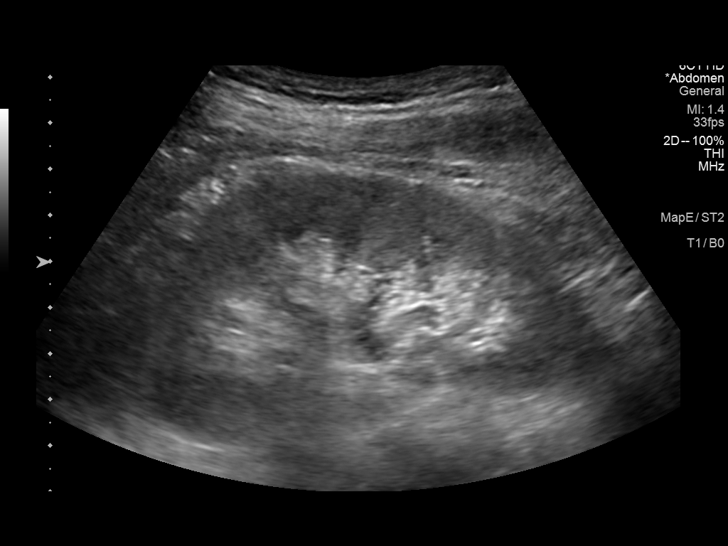
[im 76/91]
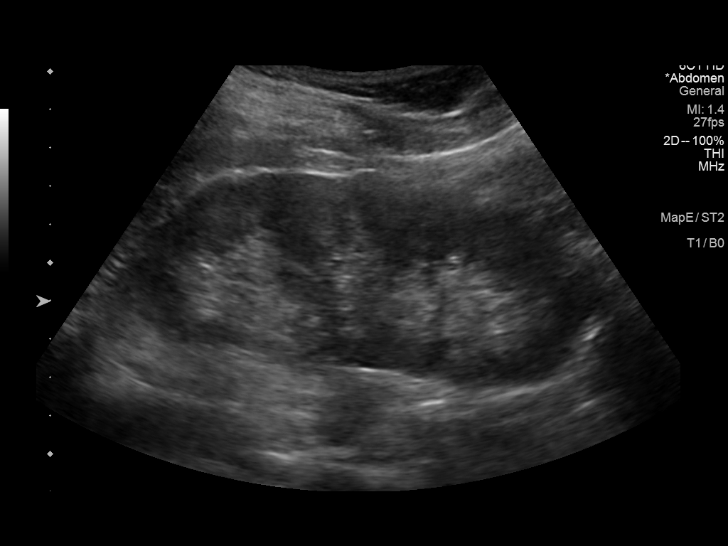
[im 83/91]
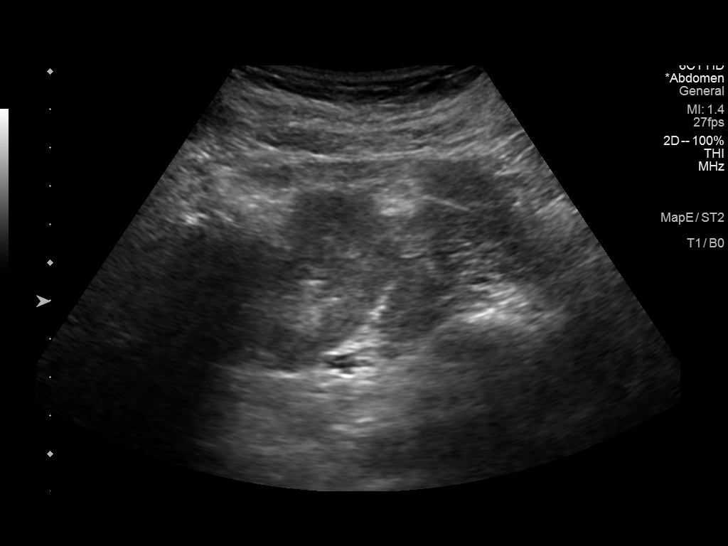
[im 91/91]
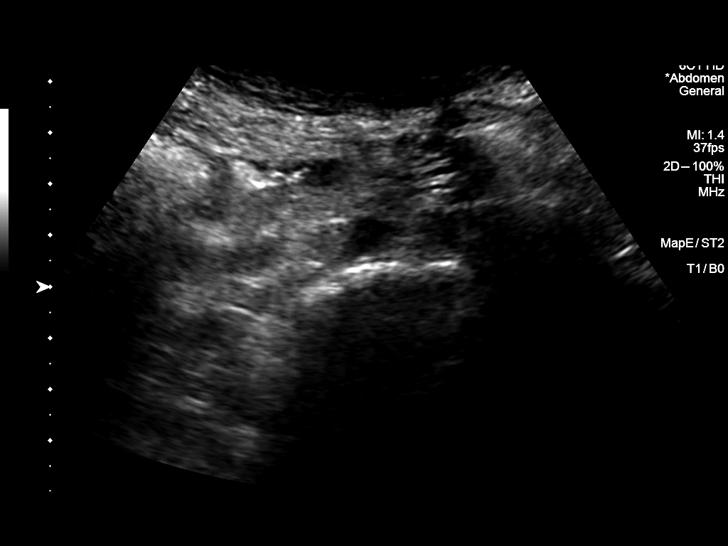

[14 of 25 positions shown; findings below may reference images not displayed]

FINDINGS: Gallbladder: No gallstones or wall thickening visualized. No
sonographic Murphy sign noted by sonographer.

Common bile duct: Diameter: 3.4 mm.

Liver: No focal lesion identified. Within normal limits in
parenchymal echogenicity. Portal vein is patent on color Doppler
imaging with normal direction of blood flow towards the liver.

IVC: No abnormality visualized.

Pancreas: Visualized portion unremarkable.

Spleen: Size and appearance within normal limits.

Right Kidney: Length: 11.0 cm. Echogenicity within normal limits. No
mass or hydronephrosis visualized.

Left Kidney: Length: 11.8 cm. Echogenicity within normal limits. No
mass or hydronephrosis visualized.

Abdominal aorta: No aneurysm visualized.

Other findings: None.
IMPRESSION: 1. Normal abdominal ultrasound.
